# Patient Record
Sex: Female | Born: 1970 | Race: White | Hispanic: No | Marital: Married | Smoking: Never smoker
Health system: Southern US, Community
[De-identification: ages and names within clinical notes are randomized; demographics above are authoritative.]

## PROBLEM LIST (undated history)

## (undated) DIAGNOSIS — N920 Excessive and frequent menstruation with regular cycle: Secondary | ICD-10-CM

## (undated) DIAGNOSIS — D251 Intramural leiomyoma of uterus: Secondary | ICD-10-CM

## (undated) DIAGNOSIS — D649 Anemia, unspecified: Secondary | ICD-10-CM

## (undated) DIAGNOSIS — I10 Essential (primary) hypertension: Secondary | ICD-10-CM

## (undated) DIAGNOSIS — R062 Wheezing: Secondary | ICD-10-CM

## (undated) DIAGNOSIS — K649 Unspecified hemorrhoids: Secondary | ICD-10-CM

## (undated) DIAGNOSIS — E559 Vitamin D deficiency, unspecified: Secondary | ICD-10-CM

## (undated) DIAGNOSIS — J45909 Unspecified asthma, uncomplicated: Secondary | ICD-10-CM

## (undated) HISTORY — DX: Intramural leiomyoma of uterus: D25.1

## (undated) HISTORY — DX: Unspecified asthma, uncomplicated: J45.909

## (undated) HISTORY — DX: Unspecified hemorrhoids: K64.9

## (undated) HISTORY — DX: Wheezing: R06.2

## (undated) HISTORY — PX: NO PAST SURGERIES: SHX2092

## (undated) HISTORY — DX: Anemia, unspecified: D64.9

## (undated) HISTORY — DX: Essential (primary) hypertension: I10

## (undated) HISTORY — PX: OTHER SURGICAL HISTORY: SHX169

## (undated) HISTORY — DX: Excessive and frequent menstruation with regular cycle: N92.0

## (undated) HISTORY — DX: Vitamin D deficiency, unspecified: E55.9

---

## 2000-12-10 ENCOUNTER — Other Ambulatory Visit: Admission: RE | Admit: 2000-12-10 | Discharge: 2000-12-10 | Payer: Self-pay | Admitting: Obstetrics and Gynecology

## 2008-11-07 ENCOUNTER — Other Ambulatory Visit: Admission: RE | Admit: 2008-11-07 | Discharge: 2008-11-07 | Payer: Self-pay | Admitting: Obstetrics and Gynecology

## 2011-06-24 ENCOUNTER — Other Ambulatory Visit (HOSPITAL_COMMUNITY)
Admission: RE | Admit: 2011-06-24 | Discharge: 2011-06-24 | Disposition: A | Discharge status: Home / Self Care | Payer: PRIVATE HEALTH INSURANCE | Source: Ambulatory Visit | Attending: Obstetrics and Gynecology | Admitting: Obstetrics and Gynecology

## 2011-06-24 DIAGNOSIS — Z01419 Encounter for gynecological examination (general) (routine) without abnormal findings: Secondary | ICD-10-CM | POA: Insufficient documentation

## 2012-08-17 ENCOUNTER — Encounter: Payer: Self-pay | Admitting: *Deleted

## 2012-08-18 ENCOUNTER — Encounter: Payer: Self-pay | Admitting: Adult Health

## 2012-08-18 ENCOUNTER — Other Ambulatory Visit (HOSPITAL_COMMUNITY)
Admission: RE | Admit: 2012-08-18 | Discharge: 2012-08-18 | Disposition: A | Discharge status: Home / Self Care | Payer: PRIVATE HEALTH INSURANCE | Source: Ambulatory Visit | Attending: Adult Health | Admitting: Adult Health

## 2012-08-18 ENCOUNTER — Ambulatory Visit (INDEPENDENT_AMBULATORY_CARE_PROVIDER_SITE_OTHER): Payer: PRIVATE HEALTH INSURANCE | Admitting: Adult Health

## 2012-08-18 VITALS — BP 140/100 | HR 78 | Ht 70.0 in | Wt 192.0 lb

## 2012-08-18 DIAGNOSIS — I10 Essential (primary) hypertension: Secondary | ICD-10-CM | POA: Insufficient documentation

## 2012-08-18 DIAGNOSIS — Z01419 Encounter for gynecological examination (general) (routine) without abnormal findings: Secondary | ICD-10-CM

## 2012-08-18 DIAGNOSIS — Z1151 Encounter for screening for human papillomavirus (HPV): Secondary | ICD-10-CM | POA: Insufficient documentation

## 2012-08-18 DIAGNOSIS — Z1212 Encounter for screening for malignant neoplasm of rectum: Secondary | ICD-10-CM

## 2012-08-18 HISTORY — DX: Essential (primary) hypertension: I10

## 2012-08-18 LAB — CBC
HCT: 32.7 % — ABNORMAL LOW (ref 36.0–46.0)
MCHC: 33 g/dL (ref 30.0–36.0)
Platelets: 348 10*3/uL (ref 150–400)
RDW: 15.3 % (ref 11.5–15.5)

## 2012-08-18 LAB — COMPREHENSIVE METABOLIC PANEL
ALT: 13 U/L (ref 0–35)
AST: 10 U/L (ref 0–37)
Albumin: 3.9 g/dL (ref 3.5–5.2)
Alkaline Phosphatase: 46 U/L (ref 39–117)
BUN: 10 mg/dL (ref 6–23)
Potassium: 4.2 mEq/L (ref 3.5–5.3)
Sodium: 136 mEq/L (ref 135–145)

## 2012-08-18 LAB — HEMOCCULT GUIAC POC 1CARD (OFFICE)

## 2012-08-18 LAB — LIPID PANEL
LDL Cholesterol: 78 mg/dL (ref 0–99)
Triglycerides: 50 mg/dL (ref <150)
VLDL: 10 mg/dL (ref 0–40)

## 2012-08-18 MED ORDER — HYDROCHLOROTHIAZIDE 12.5 MG PO CAPS: 12.5000 mg | ORAL_CAPSULE | Freq: Every day | ORAL | Status: DC

## 2012-08-18 NOTE — Patient Instructions (Addendum)
Start HCTZ and decrease salt Increase activity Mammogram now Return 4 weeks check BP, and check at home and record Sign up for my chart

## 2012-08-18 NOTE — Progress Notes (Signed)
Patient ID: Christie Griffin, female   DOB: [birth year], 42 y.o.   MRN: [id number] History of Present Illness: Christie Griffin is a 42 year old white female in for pap and physical.   Current Medications, Allergies, Past Medical History, Past Surgical History, Family History and Social History were reviewed in [employer] medical record.     Review of Systems: Patient denies any headaches, blurred vision, shortness of breath, chest pain, abdominal pain, problems with bowel movements, urination, or intercourse. No joint swelling or mood changes. She is working 12 hour shifts at [employer] in [place]. She uses condoms.    Physical Exam:Blood pressure 140/100, pulse 78, height 5\' 10"  (1.778 m), weight 192 lb (87.091 kg), last menstrual period 07/21/2012.BP arrival 142/98 General:  Well developed, well nourished, no acute distress Skin:  Warm and dry Neck:  Midline trachea, normal thyroid Lungs; Clear to auscultation bilaterally Breast:  No dominant palpable mass, retraction, or nipple discharge Cardiovascular: Regular rate and rhythm Abdomen:  Soft, non tender, no hepatosplenomegaly Pelvic:  External genitalia is normal in appearance.  The vagina is normal in appearance. The cervix is bulbous. Pap performed with HPV.  Uterus is felt to be normal size, shape, and contour.  No adnexal masses or tenderness noted. Rectal: Good sphincter tone, no polyps, or hemorrhoids felt.  Hemoccult negative.she does have a hemorrhoid tag. Extremities:  No swelling or varicosities noted Psych:  Alert and cooperative, seems happy but a little stressed, her daughter graduates this year and one of her friends died suddenly yesterday.  Impression: Yearly exam Hypertension  Plan: Check CBC,CMP,TSH,Lipids Get mammogram now  Start HCTZ 12.5 mg 1 daily, decrease salt, increase activity and check BP at home and record Return in 4 weeks to check BP and 1 year for physical

## 2012-08-22 ENCOUNTER — Telehealth: Payer: Self-pay | Admitting: Adult Health

## 2012-08-22 NOTE — Telephone Encounter (Signed)
Pt aware of labs, take MV with Fe, she has checked her BP and ranges138-142/92-94, keep checking it and follow as scheduled.

## 2012-09-13 ENCOUNTER — Encounter: Payer: Self-pay | Admitting: Adult Health

## 2012-09-13 ENCOUNTER — Ambulatory Visit (INDEPENDENT_AMBULATORY_CARE_PROVIDER_SITE_OTHER): Payer: PRIVATE HEALTH INSURANCE | Admitting: Adult Health

## 2012-09-13 VITALS — BP 122/84 | Ht 70.0 in | Wt 183.0 lb

## 2012-09-13 DIAGNOSIS — I10 Essential (primary) hypertension: Secondary | ICD-10-CM

## 2012-09-13 NOTE — Progress Notes (Signed)
Subjective:     Patient ID: Christie Griffin, female   DOB: 09/01/1970, 42 y.o.   MRN: [id number]  HPI Christie Griffin is back for BP check. She is taking the microzide and decreasing her caffeine and increasing her water.  Review of Systems No complaints Reviewed past medical,surgical, social and family history. Reviewed medications and allergies.     Objective:   Physical Exam Blood pressure 122/84, height 5\' 10"  (1.778 m), weight 183 lb (83.008 kg), last menstrual period 07/21/2012. Skin warm and dry. Neck: mid line trachea, normal thyroid. Lungs: clear to ausculation bilaterally. Cardiovascular: regular rate and rhythm.   She has lost 9 pounds and is feeling good. Assessment:    Hypertension    Plan:    Continue current plan  Follow up in 3 months

## 2012-09-13 NOTE — Patient Instructions (Addendum)
Continue with plan Follow up in 3 months

## 2012-12-28 ENCOUNTER — Telehealth: Payer: Self-pay | Admitting: Adult Health

## 2012-12-28 NOTE — Telephone Encounter (Signed)
Ordered support  Knee his and orthotic inserts for her due to pain

## 2012-12-28 NOTE — Telephone Encounter (Signed)
Spoke with pt. She works at [employer]. Was measured for orthotics. She needs a paper filled out but hasn't seen Dr. Sherril Croon in a long time because she usually sees employee health for sickness and hasn't had to see him. Are you willing to look at this paper and see if you can fill it out? Pt left numbers to the [facility] if you need them, [phone] press 4 (office), [phone] (cell) [facility].

## 2013-01-16 ENCOUNTER — Ambulatory Visit: Payer: PRIVATE HEALTH INSURANCE | Admitting: Adult Health

## 2013-04-20 DIAGNOSIS — D649 Anemia, unspecified: Secondary | ICD-10-CM

## 2013-04-20 HISTORY — DX: Anemia, unspecified: D64.9

## 2013-09-20 ENCOUNTER — Ambulatory Visit (INDEPENDENT_AMBULATORY_CARE_PROVIDER_SITE_OTHER): Payer: PRIVATE HEALTH INSURANCE | Admitting: Adult Health

## 2013-09-20 ENCOUNTER — Ambulatory Visit (HOSPITAL_COMMUNITY)
Admission: RE | Admit: 2013-09-20 | Discharge: 2013-09-20 | Disposition: A | Discharge status: Home / Self Care | Payer: PRIVATE HEALTH INSURANCE | Source: Ambulatory Visit | Attending: Adult Health | Admitting: Adult Health

## 2013-09-20 ENCOUNTER — Encounter: Payer: Self-pay | Admitting: Adult Health

## 2013-09-20 ENCOUNTER — Telehealth: Payer: Self-pay | Admitting: Adult Health

## 2013-09-20 ENCOUNTER — Other Ambulatory Visit: Payer: Self-pay | Admitting: Adult Health

## 2013-09-20 VITALS — BP 144/96 | HR 76 | Ht 67.0 in | Wt 195.0 lb

## 2013-09-20 DIAGNOSIS — K649 Unspecified hemorrhoids: Secondary | ICD-10-CM | POA: Insufficient documentation

## 2013-09-20 DIAGNOSIS — N920 Excessive and frequent menstruation with regular cycle: Secondary | ICD-10-CM | POA: Insufficient documentation

## 2013-09-20 DIAGNOSIS — D649 Anemia, unspecified: Secondary | ICD-10-CM

## 2013-09-20 DIAGNOSIS — R062 Wheezing: Secondary | ICD-10-CM

## 2013-09-20 DIAGNOSIS — I10 Essential (primary) hypertension: Secondary | ICD-10-CM

## 2013-09-20 DIAGNOSIS — Z1231 Encounter for screening mammogram for malignant neoplasm of breast: Secondary | ICD-10-CM

## 2013-09-20 DIAGNOSIS — Z139 Encounter for screening, unspecified: Secondary | ICD-10-CM

## 2013-09-20 DIAGNOSIS — Z01419 Encounter for gynecological examination (general) (routine) without abnormal findings: Secondary | ICD-10-CM

## 2013-09-20 DIAGNOSIS — Z1212 Encounter for screening for malignant neoplasm of rectum: Secondary | ICD-10-CM

## 2013-09-20 HISTORY — DX: Wheezing: R06.2

## 2013-09-20 HISTORY — DX: Excessive and frequent menstruation with regular cycle: N92.0

## 2013-09-20 HISTORY — DX: Unspecified hemorrhoids: K64.9

## 2013-09-20 LAB — HEMOCCULT GUIAC POC 1CARD (OFFICE): FECAL OCCULT BLD: NEGATIVE

## 2013-09-20 LAB — CBC
HCT: 39.1 % (ref 36.0–46.0)
HEMOGLOBIN: 13.1 g/dL (ref 12.0–15.0)
MCH: 28.4 pg (ref 26.0–34.0)
MCHC: 33.5 g/dL (ref 30.0–36.0)
MCV: 84.8 fL (ref 78.0–100.0)
PLATELETS: 321 10*3/uL (ref 150–400)
RBC: 4.61 MIL/uL (ref 3.87–5.11)
RDW: 14.1 % (ref 11.5–15.5)
WBC: 5.9 10*3/uL (ref 4.0–10.5)

## 2013-09-20 NOTE — Progress Notes (Signed)
Patient ID: Christie Griffin, female   DOB: [birth year], 43 y.o.   MRN: [id number] History of Present Illness: Christie Griffin is a 43 year old white female,married in for a physical.She had a normal pap with negative HPV 08/18/12.She is complaining of heavy menses but they are short and has clots,no pain and has had allergies and has had some wheezing.Her Mom past away 07/02/13.   Current Medications, Allergies, Past Medical History, Past Surgical History, Family History and Social History were reviewed in [employer] record.     Review of Systems: Patient denies any headaches, blurred vision, shortness of breath, chest pain, abdominal pain, problems with bowel movements, urination, or intercourse. No joint pain or mood swings,see HPI for positives.She says BP fine at home and work usually about 128/86.    Physical Exam:BP 144/96  Pulse 76  Ht 5\' 7"  (1.702 m)  Wt 195 lb (88.451 kg)  BMI 30.53 kg/m2  LMP 09/01/2013 General:  Well developed, well nourished, no acute distress Skin:  Warm and dry Neck:  Midline trachea, normal thyroid Lungs: there is wheezing on left, right sounds clear Breast:  No dominant palpable mass, retraction, or nipple discharge Cardiovascular: Regular rate and rhythm Abdomen:  Soft, non tender, no hepatosplenomegaly Pelvic:  External genitalia is normal in appearance.  The vagina is normal in appearance. The cervix is bulbous.  Uterus is felt to be normal size, shape, and contour.  No   adnexal masses or tenderness noted. Rectal: Good sphincter tone, no polyps,internal hemorrhoids felt at 5 0'clock  Hemoccult negative. Extremities:  No swelling or varicosities noted Psych:  No mood changes, alert and cooperative,seems happy Discussed ablation for heavy periods, if thinks she may want will get [place] first.  Impression: Yearly gyn exam no pap Hemorrhoids Hypertension Heavy menses History of anemia Wheezing     Plan: Physical in 1  year Continue BP meds and check BP at home if looks like rising take 2 tabs and recheck for several days and let me know Check CBC Chest xray today Take Iron OTC and try zyrtec Review handout on ablation and hemorrhoids and hemorrhoid banding Will talk when CBC and xray back

## 2013-09-20 NOTE — Telephone Encounter (Signed)
Left message that chest xray normal.

## 2013-09-20 NOTE — Patient Instructions (Signed)
Hemorrhoids Hemorrhoids are swollen veins around the rectum or anus. There are two types of hemorrhoids:   Internal hemorrhoids. These occur in the veins just inside the rectum. They may poke through to the outside and become irritated and painful.  External hemorrhoids. These occur in the veins outside the anus and can be felt as a painful swelling or hard lump near the anus. CAUSES  Pregnancy.   Obesity.   Constipation or diarrhea.   Straining to have a bowel movement.   Sitting for long periods on the toilet.  Heavy lifting or other activity that caused you to strain.  Anal intercourse. SYMPTOMS   Pain.   Anal itching or irritation.   Rectal bleeding.   Fecal leakage.   Anal swelling.   One or more lumps around the anus.  DIAGNOSIS  Your caregiver may be able to diagnose hemorrhoids by visual examination. Other examinations or tests that may be performed include:   Examination of the rectal area with a gloved hand (digital rectal exam).   Examination of anal canal using a small tube (scope).   A blood test if you have lost a significant amount of blood.  A test to look inside the colon (sigmoidoscopy or colonoscopy). TREATMENT Most hemorrhoids can be treated at home. However, if symptoms do not seem to be getting better or if you have a lot of rectal bleeding, your caregiver may perform a procedure to help make the hemorrhoids get smaller or remove them completely. Possible treatments include:   Placing a rubber band at the base of the hemorrhoid to cut off the circulation (rubber band ligation).   Injecting a chemical to shrink the hemorrhoid (sclerotherapy).   Using a tool to burn the hemorrhoid (infrared light therapy).   Surgically removing the hemorrhoid (hemorrhoidectomy).   Stapling the hemorrhoid to block blood flow to the tissue (hemorrhoid stapling).  HOME CARE INSTRUCTIONS   Eat foods with fiber, such as whole grains, beans,  nuts, fruits, and vegetables. Ask your doctor about taking products with added fiber in them (fibersupplements).  Increase fluid intake. Drink enough water and fluids to keep your urine clear or pale yellow.   Exercise regularly.   Go to the bathroom when you have the urge to have a bowel movement. Do not wait.   Avoid straining to have bowel movements.   Keep the anal area dry and clean. Use wet toilet paper or moist towelettes after a bowel movement.   Medicated creams and suppositories may be used or applied as directed.   Only take over-the-counter or prescription medicines as directed by your caregiver.   Take warm sitz baths for 15 20 minutes, 3 4 times a day to ease pain and discomfort.   Place ice packs on the hemorrhoids if they are tender and swollen. Using ice packs between sitz baths may be helpful.   Put ice in a plastic bag.   Place a towel between your skin and the bag.   Leave the ice on for 15 20 minutes, 3 4 times a day.   Do not use a donut-shaped pillow or sit on the toilet for long periods. This increases blood pooling and pain.  SEEK MEDICAL CARE IF:  You have increasing pain and swelling that is not controlled by treatment or medicine.  You have uncontrolled bleeding.  You have difficulty or you are unable to have a bowel movement.  You have pain or inflammation outside the area of the hemorrhoids. MAKE SURE YOU:    Understand these instructions.  Will watch your condition.  Will get help right away if you are not doing well or get worse. Document Released: 04/03/2000 Document Revised: 03/23/2012 Document Reviewed: 02/09/2012 [facility] Patient Information 2014 [place]. Physical in 1 year  Mammogram now

## 2013-09-21 ENCOUNTER — Telehealth: Payer: Self-pay | Admitting: Adult Health

## 2013-09-21 NOTE — Telephone Encounter (Signed)
Pt aware CBC normal

## 2013-09-26 ENCOUNTER — Ambulatory Visit (HOSPITAL_COMMUNITY)
Admission: RE | Admit: 2013-09-26 | Discharge: 2013-09-26 | Disposition: A | Discharge status: Home / Self Care | Payer: PRIVATE HEALTH INSURANCE | Source: Ambulatory Visit | Attending: Adult Health | Admitting: Adult Health

## 2013-09-26 DIAGNOSIS — Z1231 Encounter for screening mammogram for malignant neoplasm of breast: Secondary | ICD-10-CM | POA: Insufficient documentation

## 2013-10-16 ENCOUNTER — Telehealth: Payer: Self-pay | Admitting: Adult Health

## 2013-10-16 MED ORDER — FLUCONAZOLE 150 MG PO TABS: ORAL_TABLET | ORAL | Status: DC

## 2013-10-16 NOTE — Telephone Encounter (Signed)
rx diflucan 150 mg # 2 take 1 now and 1 in 3 days if needed with 2 refills

## 2013-10-16 NOTE — Telephone Encounter (Signed)
Spoke with pt. Pt states she started with a yeast infection Friday. She is requesting Diflucan. Pt is at work today. Please advise. Thanks!!! [employer]

## 2013-10-16 NOTE — Telephone Encounter (Signed)
Left message x 1. JSY 

## 2013-10-24 ENCOUNTER — Telehealth: Payer: Self-pay | Admitting: Adult Health

## 2013-10-24 NOTE — Telephone Encounter (Signed)
Still has cough see PCP, had normal chest xray

## 2014-02-19 ENCOUNTER — Encounter: Payer: Self-pay | Admitting: Adult Health

## 2014-05-30 ENCOUNTER — Telehealth: Payer: Self-pay | Admitting: Adult Health

## 2014-05-30 NOTE — Telephone Encounter (Signed)
Wants number to GI for hemorrhoid banding, gave number to [facility]

## 2014-06-28 ENCOUNTER — Encounter: Payer: Self-pay | Admitting: Gastroenterology

## 2014-06-28 ENCOUNTER — Ambulatory Visit (INDEPENDENT_AMBULATORY_CARE_PROVIDER_SITE_OTHER): Payer: PRIVATE HEALTH INSURANCE | Admitting: Gastroenterology

## 2014-06-28 VITALS — BP 148/99 | HR 83 | Temp 98.5°F | Ht 70.0 in | Wt 203.0 lb

## 2014-06-28 DIAGNOSIS — K642 Third degree hemorrhoids: Secondary | ICD-10-CM

## 2014-06-28 NOTE — Progress Notes (Addendum)
   Subjective:    Patient ID: Christie Griffin, female    DOB: 09/30/1970, 44 y.o.   MRN: [id number]  GRIFFIN, JENNIFER, NP-C  HPI  HAS A SKIN TAG ON HER HEMORRHOID AND IT DRIVES HER CRAZY. OFFERED TO CUT IT OFF BUT DECLINED. OVERTIME GOTTEN BIGGER. NO TCS. WENT THROUGH A PHASE HER Hb HAS BEEN LOW. TAKIGN IRON WITH MULTIVIT. MAY HAVE RECTAL ITCHING BUT TAKES STOOL SOFTENER. NO BLOOD IN STOOL.  BMs: 1-2X/DAY. NO WEIGHT LOSS. APPETITE: GOOD. VAGINAL DELIVERY x1(LABOR 15.5 HRS, PUSHED FOR 3 HRS)  PT DENIES FEVER, CHILLS, HEMATOCHEZIA, nausea, vomiting, melena, diarrhea, CHEST PAIN, SHORTNESS OF BREATH, abdominal pain, problems swallowing, OR heartburn or indigestion.   Past Medical History  Diagnosis Date  . Hypertension 08/18/2012  . Hemorrhoid 09/20/2013  . Heavy menses 09/20/2013  . Wheezing 09/20/2013  . Anemia 2015    AUG 2015 Hb 13    Past Surgical History  Procedure Laterality Date  . No past surgeries      No Known Allergies  Current Outpatient Prescriptions  Medication Sig Dispense Refill  . beclomethasone (QVAR) 80 MCG/ACT inhaler Inhale 2 puffs into the lungs 2 (two) times daily.    . cetirizine (ZYRTEC) 10 MG tablet Take 10 mg by mouth daily.    . ferrous sulfate 325 (65 FE) MG tablet Take 325 mg by mouth daily with breakfast.    .      .       Family History  Problem Relation Age of Onset  . Cancer Other     lung,liver,renal,brain,throat,prostate  . Heart disease Mother   . CAD Mother   . Leukemia Mother     CLL  . Diabetes Father   . Cancer Father     kidney to liver lungs  . Cancer Brother 50    brain tumor related to [place]  . Hypertension Brother   . Colon cancer Neg Hx   . Colon polyps Neg Hx    History  Substance Use Topics  . Smoking status: Never Smoker   . Smokeless tobacco: Never Used  . Alcohol Use: No    Review of Systems PER HPI OTHERWISE ALL SYSTEMS ARE NEGATIVE.     Objective:   Physical Exam  Constitutional: She is  oriented to person, place, and time. She appears well-developed and well-nourished. No distress.  HENT:  Head: Normocephalic and atraumatic.  Mouth/Throat: Oropharynx is clear and moist. No oropharyngeal exudate.  Eyes: Pupils are equal, round, and reactive to light. No scleral icterus.  Neck: Normal range of motion. Neck supple.  Cardiovascular: Normal rate, regular rhythm and normal heart sounds.   Pulmonary/Chest: Effort normal and breath sounds normal. No respiratory distress.  Abdominal: Soft. Bowel sounds are normal. She exhibits no distension. There is no tenderness.  Genitourinary: Rectal exam shows internal hemorrhoid. Rectal exam shows no fissure, no mass and anal tone normal.     SKIN TAG  Musculoskeletal: She exhibits no edema.  Lymphadenopathy:    She has no cervical adenopathy.  Neurological: She is alert and oriented to person, place, and time.  NO FOCAL DEFICITS   Psychiatric: She has a normal mood and affect.  Vitals reviewed.   PROCEDURE; ANOSCOPY SCOPE PLACED. REDUNDANT TISSUE IN R ANTERIOR AND POSTERIOR LESS SO IN THE LEFT LATERAL HEMORRHOID BUNDLE. FORMED STOOL IN RECTUM        Assessment & Plan:

## 2014-06-28 NOTE — Patient Instructions (Signed)
DRINK WATER TO KEEP YOUR URINE LIGHT YELLOW.  FOLLOW A HIGH FIBER DIET.  AVOID ITEMS THAT CAUSE BLOATING & GAS.SEE INFO BELOW.  CALL JENNIFER  TO SCHEDULE EXCISION.  SCREENING colonoscopy AT AGE 44.  High-Fiber Diet A high-fiber diet changes your normal diet to include more whole grains, legumes, fruits, and vegetables. Changes in the diet involve replacing refined carbohydrates with unrefined foods. The calorie level of the diet is essentially unchanged. The Dietary Reference Intake (recommended amount) for adult males is 38 grams per day. For adult females, it is 25 grams per day. Pregnant and lactating women should consume 28 grams of fiber per day. Fiber is the intact part of a plant that is not broken down during digestion. Functional fiber is fiber that has been isolated from the plant to provide a beneficial effect in the body. PURPOSE  Increase stool bulk.   Ease and regulate bowel movements.   Lower cholesterol.  INDICATIONS THAT YOU NEED MORE FIBER  Constipation and hemorrhoids.   Uncomplicated diverticulosis (intestine condition) and irritable bowel syndrome.   Weight management.   As a protective measure against hardening of the arteries (atherosclerosis), diabetes, and cancer.   GUIDELINES FOR INCREASING FIBER IN THE DIET  Start adding fiber to the diet slowly. A gradual increase of about 5 more grams (2 slices of whole-wheat bread, 2 servings of most fruits or vegetables, or 1 bowl of high-fiber cereal) per day is best. Too rapid an increase in fiber may result in constipation, flatulence, and bloating.   Drink enough water and fluids to keep your urine clear or pale yellow. Water, juice, or caffeine-free drinks are recommended. Not drinking enough fluid may cause constipation.   Eat a variety of high-fiber foods rather than one type of fiber.   Try to increase your intake of fiber through using high-fiber foods rather than fiber pills or supplements that contain  small amounts of fiber.   The goal is to change the types of food eaten. Do not supplement your present diet with high-fiber foods, but replace foods in your present diet.  INCLUDE A VARIETY OF FIBER SOURCES  Replace refined and processed grains with whole grains, canned fruits with fresh fruits, and incorporate other fiber sources. White rice, white breads, and most bakery goods contain little or no fiber.   Brown whole-grain rice, buckwheat oats, and many fruits and vegetables are all good sources of fiber. These include: broccoli, Brussels sprouts, cabbage, cauliflower, beets, sweet potatoes, white potatoes (skin on), carrots, tomatoes, eggplant, squash, berries, fresh fruits, and dried fruits.   Cereals appear to be the richest source of fiber. Cereal fiber is found in whole grains and bran. Bran is the fiber-rich outer coat of cereal grain, which is largely removed in refining. In whole-grain cereals, the bran remains. In breakfast cereals, the largest amount of fiber is found in those with "bran" in their names. The fiber content is sometimes indicated on the label.   You may need to include additional fruits and vegetables each day.   In baking, for 1 cup white flour, you may use the following substitutions:   1 cup whole-wheat flour minus 2 tablespoons.   1/2 cup white flour plus 1/2 cup whole-wheat flour.

## 2014-06-28 NOTE — Assessment & Plan Note (Signed)
ASSOCIATED WITH LARGE SKIN TAG. MODERATE INTERNAL HEMORRHOIDS BUT RELATIVELY ASYMPTOMATIC  DRINK WATER EAT FIBER DISCUSSED EXCISION WITH JENNIFER GRIFFIN, NP-c AFTER [facility]. PT WILL CONTACT TO SCHEDULE. SCREENING TCS AT AGE 44.

## 2014-09-26 ENCOUNTER — Encounter: Payer: Self-pay | Admitting: Adult Health

## 2014-09-26 ENCOUNTER — Ambulatory Visit (INDEPENDENT_AMBULATORY_CARE_PROVIDER_SITE_OTHER): Payer: PRIVATE HEALTH INSURANCE | Admitting: Adult Health

## 2014-09-26 VITALS — BP 138/88 | HR 84 | Ht 70.0 in | Wt 199.0 lb

## 2014-09-26 DIAGNOSIS — I10 Essential (primary) hypertension: Secondary | ICD-10-CM

## 2014-09-26 DIAGNOSIS — Z1212 Encounter for screening for malignant neoplasm of rectum: Secondary | ICD-10-CM | POA: Diagnosis not present

## 2014-09-26 DIAGNOSIS — K644 Residual hemorrhoidal skin tags: Secondary | ICD-10-CM

## 2014-09-26 DIAGNOSIS — K649 Unspecified hemorrhoids: Secondary | ICD-10-CM

## 2014-09-26 DIAGNOSIS — Z01419 Encounter for gynecological examination (general) (routine) without abnormal findings: Secondary | ICD-10-CM

## 2014-09-26 LAB — HEMOCCULT GUIAC POC 1CARD (OFFICE): Fecal Occult Blood, POC: NEGATIVE

## 2014-09-26 MED ORDER — HYDROCHLOROTHIAZIDE 12.5 MG PO CAPS: 12.5000 mg | ORAL_CAPSULE | Freq: Every day | ORAL | Status: DC

## 2014-09-26 NOTE — Progress Notes (Signed)
Patient ID: Christie Griffin, female   DOB: Oct 06, 1970, 44 y.o.   MRN: [id number] History of Present Illness: Christie Griffin is a 44 year old white female, married in for well woman gyn exam.She had a normal pap with negative HPV 08/18/12.   Current Medications, Allergies, Past Medical History, Past Surgical History, Family History and Social History were reviewed in [employer] record.     Review of Systems: Patient denies any headaches, hearing loss, fatigue, blurred vision, shortness of breath, chest pain, abdominal pain, problems with bowel movements, urination, or intercourse. 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 Saw Dr Oneida Alar about hemorrhoids.   Physical Exam:BP 138/88 mmHg  Pulse 84  Ht 5\' 10"  (1.778 m)  Wt 199 lb (90.266 kg)  BMI 28.55 kg/m2  LMP 09/21/2014 General:  Well developed, well nourished, no acute distress Skin:  Warm and dry Neck:  Midline trachea, normal thyroid, good ROM, no lymphadenopathy Lungs; Clear to auscultation bilaterally Breast:  No dominant palpable mass, retraction, or nipple discharge Cardiovascular: Regular rate and rhythm Abdomen:  Soft, non tender, no hepatosplenomegaly Pelvic:  External genitalia is normal in appearance, no lesions.  The vagina is normal in appearance. Urethra has no lesions or masses. The cervix is bulbous.  Uterus is felt to be normal size, shape, and contour.  No adnexal masses or tenderness noted.Bladder is non tender, no masses felt. Rectal: Good sphincter tone, no polyps, internal hemorrhoids felt.Has skin tag,  Hemoccult negative. Extremities/musculoskeletal:  No swelling or varicosities noted, no clubbing or cyanosis Psych:  No mood changes, alert and cooperative,seems happy   Impression: Well woman gyn exam no pap Hemorrhoids Skin tag Hypertension    Plan: Refilled  microzide 12.5 mg #30 take 1 daily with 11 refills Return in 1 year for pap and physical Mammogram yearly Fasting labs next year

## 2014-09-26 NOTE — Patient Instructions (Signed)
Get mammogram yearly Pap and physical in 1 year

## 2015-05-13 ENCOUNTER — Other Ambulatory Visit: Payer: Self-pay | Admitting: Neurology

## 2015-05-13 MED ORDER — BECLOMETHASONE DIPROPIONATE 80 MCG/ACT IN AERS: 2.0000 | INHALATION_SPRAY | Freq: Two times a day (BID) | RESPIRATORY_TRACT | Status: DC

## 2015-07-18 ENCOUNTER — Telehealth: Payer: Self-pay | Admitting: Adult Health

## 2015-07-18 NOTE — Telephone Encounter (Signed)
Pt called stating that she went to her primary doctor for her sinus and they perscribed her an antibiotic and now she has a yeast infection. Pt would like to know if Christie Griffin could call in some Diflucan for her. Please contact pt

## 2015-07-18 NOTE — Telephone Encounter (Signed)
Spoke with pt. Pt went to Urgent Care and was prescribed an antibiotic. Now she has a yeast infection. Pt is requesting Diflucan. I advised pt that you was out of the office until tomorrow. Pt wants message sent to you to review tomorrow. Thanks!! Clay

## 2015-07-19 MED ORDER — FLUCONAZOLE 150 MG PO TABS: ORAL_TABLET | ORAL | Status: DC

## 2015-07-19 NOTE — Telephone Encounter (Signed)
Will rx diflucan  

## 2015-07-26 ENCOUNTER — Other Ambulatory Visit: Payer: Self-pay | Admitting: Allergy and Immunology

## 2015-07-26 MED ORDER — ALBUTEROL SULFATE HFA 108 (90 BASE) MCG/ACT IN AERS: 2.0000 | INHALATION_SPRAY | RESPIRATORY_TRACT | Status: DC | PRN

## 2015-07-31 ENCOUNTER — Other Ambulatory Visit: Payer: Self-pay | Admitting: Allergy and Immunology

## 2015-07-31 MED ORDER — BECLOMETHASONE DIPROPIONATE 80 MCG/ACT IN AERS: 2.0000 | INHALATION_SPRAY | Freq: Two times a day (BID) | RESPIRATORY_TRACT | Status: DC

## 2015-07-31 MED ORDER — ALBUTEROL SULFATE HFA 108 (90 BASE) MCG/ACT IN AERS: 2.0000 | INHALATION_SPRAY | RESPIRATORY_TRACT | Status: DC | PRN

## 2015-07-31 NOTE — Telephone Encounter (Signed)
Called patient to let her know would send 1 more 30 day supply only to pharmacy. No answer, no machine. Sent scripts.

## 2015-07-31 NOTE — Telephone Encounter (Signed)
Pt called and made appointment for 08/29/2015 at 5.30pm, but needs rx qvar and ventolin.called into [facility].

## 2015-08-01 NOTE — Telephone Encounter (Signed)
Called pt-No answer at home number.  Called cell phone and spoke with pt.   Pt notified of 30 day supply only sent and to keep OV in May.

## 2015-08-29 ENCOUNTER — Encounter: Payer: Self-pay | Admitting: Allergy and Immunology

## 2015-08-29 ENCOUNTER — Ambulatory Visit (INDEPENDENT_AMBULATORY_CARE_PROVIDER_SITE_OTHER): Payer: PRIVATE HEALTH INSURANCE | Admitting: Allergy and Immunology

## 2015-08-29 VITALS — BP 128/86 | HR 76 | Resp 16 | Ht 68.9 in | Wt 197.3 lb

## 2015-08-29 DIAGNOSIS — J309 Allergic rhinitis, unspecified: Secondary | ICD-10-CM

## 2015-08-29 DIAGNOSIS — R05 Cough: Secondary | ICD-10-CM | POA: Diagnosis not present

## 2015-08-29 DIAGNOSIS — H1010 Acute atopic conjunctivitis, unspecified eye: Secondary | ICD-10-CM | POA: Diagnosis not present

## 2015-08-29 DIAGNOSIS — R059 Cough, unspecified: Secondary | ICD-10-CM

## 2015-08-29 DIAGNOSIS — R1110 Vomiting, unspecified: Secondary | ICD-10-CM | POA: Diagnosis not present

## 2015-08-29 MED ORDER — CETIRIZINE HCL 10 MG PO TABS: 10.0000 mg | ORAL_TABLET | Freq: Every day | ORAL | Status: DC

## 2015-08-29 MED ORDER — MOMETASONE FURO-FORMOTEROL FUM 200-5 MCG/ACT IN AERO: 2.0000 | INHALATION_SPRAY | Freq: Two times a day (BID) | RESPIRATORY_TRACT | Status: DC

## 2015-08-29 MED ORDER — ALBUTEROL SULFATE HFA 108 (90 BASE) MCG/ACT IN AERS: 2.0000 | INHALATION_SPRAY | RESPIRATORY_TRACT | Status: DC | PRN

## 2015-08-29 MED ORDER — AZELASTINE-FLUTICASONE 137-50 MCG/ACT NA SUSP: 1.0000 | Freq: Two times a day (BID) | NASAL | Status: DC

## 2015-08-29 NOTE — Progress Notes (Signed)
FOLLOW UP NOTE  RE: Christie Griffin MRN: [id number] DOB: March 02, 1971 [facility] [place] [address] [place] [id number] Date of Office Visit: 08/29/2015  Subjective:  Christie Griffin is a 45 y.o. female who presents today for Follow-up and Medication Refill  Assessment:   1. Cough, probable persistent asthma.   2. Post-tussive emesis.   3. Allergic rhinoconjunctivitis   4.      Previous concern for food allergies--avoidance and emergency action plan in place. Plan:   Meds ordered this encounter  Medications  . mometasone-formoterol (DULERA) 200-5 MCG/ACT AERO    Sig: Inhale 2 puffs into the lungs 2 (two) times daily.    Dispense:  1 Inhaler    Refill:  2  . Azelastine-Fluticasone (DYMISTA) 137-50 MCG/ACT SUSP    Sig: Place 1 spray into the nose 2 (two) times daily.    Dispense:  23 g    Refill:  3  . albuterol (VENTOLIN HFA) 108 (90 Base) MCG/ACT inhaler    Sig: Inhale 2 puffs into the lungs every 4 (four) hours as needed for wheezing or shortness of breath.    Dispense:  1 Inhaler    Refill:  1  . cetirizine (ZYRTEC) 10 MG tablet    Sig: Take 1 tablet (10 mg total) by mouth daily.    Dispense:  30 tablet    Refill:  5   Patient Instructions  1.  Obtain selected labs--specific IgE for foods at [facility]. 2.  Written information on allergy injections as reviewed today and she will communicate with her insurance on coverage/her expense. 3.  Saline nasal wash each evening at shower time. 4.  Dymista one spray twice daily. 5.  Dulera 213mcg 2 puffs twice daily, rinse, gargle spit with water after use. 6.  ProAir HFA 2 puffs every 4 as needed for cough or wheeze. 7.  Continue Zyrtec 10 mg daily. 8.  EpiPen/Benadryl as needed. 9.  Follow-up by phone in the next to 4 weeks and office in 2 months or sooner if needed.  HPI: Christie Griffin returns to the office in follow-up of allergic rhinoconjunctivitis, cough and wheeze, though she  has not been seen since March 2016--requesting medications refills.  She finds she uses albuterol 3 or 4 times a year, likely with seasonal or weather pattern changes.  She has been using Qvar twice daily, but in the summer decrease this to once daily, though still often has intermittent episodes of cough.  Recently she noticed nasal congestion, postnasal drip, fluctuating voice, slight hoarseness though no discolored drainage, difficulty in breathing, headache, sore throat or fever.  In February she received antibiotics from [place] at [facility] for a sinus infection. Denies ED or urgent care visits, prednisone or frequent antibiotic courses. Reports sleep and activity are normal. She minimizes dairy products as this contributed most to her cough, with question of egg, corn, peanut and shellfish.  Christie Griffin has a current medication list which includes the following prescription(s): albuterol, beclomethasone, cetirizine, docusate sodium, ferrous sulfate, fluticasone, hydrochlorothiazide.   Drug Allergies: No Known Allergies  Objective:   Filed Vitals:   08/29/15 1805  BP: 128/86  Pulse: 76  Resp: 16   Physical Exam  Constitutional: She is well-developed, well-nourished, and in no distress.  HENT:  Head: Atraumatic.  Right Ear: Tympanic membrane and ear canal normal.  Left Ear: Tympanic membrane and ear canal normal.  Nose: Mucosal edema present. No rhinorrhea. No epistaxis.  Mouth/Throat: Oropharynx  is clear and moist and mucous membranes are normal. No oropharyngeal exudate, posterior oropharyngeal edema or posterior oropharyngeal erythema.  Neck: Neck supple.  Cardiovascular: Normal rate, S1 normal and S2 normal.   No murmur heard. Pulmonary/Chest: Effort normal. She has no wheezes. She has no rhonchi. She has no rales.  Lymphadenopathy:    She has no cervical adenopathy.   Diagnostics: Spirometry:  FVC 2.63--74%, FEV1 2.08--71%.    Roselyn M. Ishmael Holter, MD  cc:  [facility]

## 2015-08-29 NOTE — Patient Instructions (Signed)
   Selected labs at [facility].  Written information on allergy injections.  Saline nasal wash each evening at shower time.  Dymista one spray twice daily.  Dulera 243mcg 2 puffs twice daily, rinse, gargle spit with water after use.  ProAir HFA 2 puffs every 4 as needed for cough or wheeze.  Continue Zyrtec 10 mg daily.  EpiPen/Benadryl as needed.  Follow-up by phone in the next to 4 weeks and office in 2 months or sooner if needed.

## 2015-09-20 ENCOUNTER — Other Ambulatory Visit: Payer: Self-pay | Admitting: Adult Health

## 2015-09-20 DIAGNOSIS — Z1231 Encounter for screening mammogram for malignant neoplasm of breast: Secondary | ICD-10-CM

## 2015-09-26 ENCOUNTER — Ambulatory Visit (HOSPITAL_COMMUNITY)
Admission: RE | Admit: 2015-09-26 | Discharge: 2015-09-26 | Disposition: A | Discharge status: Home / Self Care | Payer: PRIVATE HEALTH INSURANCE | Source: Ambulatory Visit | Attending: Adult Health | Admitting: Adult Health

## 2015-09-26 DIAGNOSIS — Z1231 Encounter for screening mammogram for malignant neoplasm of breast: Secondary | ICD-10-CM | POA: Diagnosis present

## 2015-09-27 ENCOUNTER — Encounter: Payer: Self-pay | Admitting: Adult Health

## 2015-09-27 ENCOUNTER — Other Ambulatory Visit (HOSPITAL_COMMUNITY)
Admission: RE | Admit: 2015-09-27 | Discharge: 2015-09-27 | Disposition: A | Discharge status: Home / Self Care | Payer: PRIVATE HEALTH INSURANCE | Source: Ambulatory Visit | Attending: Adult Health | Admitting: Adult Health

## 2015-09-27 ENCOUNTER — Other Ambulatory Visit: Payer: Self-pay | Admitting: Allergy and Immunology

## 2015-09-27 ENCOUNTER — Ambulatory Visit (INDEPENDENT_AMBULATORY_CARE_PROVIDER_SITE_OTHER): Payer: PRIVATE HEALTH INSURANCE | Admitting: Adult Health

## 2015-09-27 VITALS — BP 130/82 | HR 86 | Ht 69.5 in | Wt 196.0 lb

## 2015-09-27 DIAGNOSIS — K644 Residual hemorrhoidal skin tags: Secondary | ICD-10-CM | POA: Diagnosis not present

## 2015-09-27 DIAGNOSIS — Z01411 Encounter for gynecological examination (general) (routine) with abnormal findings: Secondary | ICD-10-CM

## 2015-09-27 DIAGNOSIS — Z1151 Encounter for screening for human papillomavirus (HPV): Secondary | ICD-10-CM | POA: Insufficient documentation

## 2015-09-27 DIAGNOSIS — I10 Essential (primary) hypertension: Secondary | ICD-10-CM

## 2015-09-27 DIAGNOSIS — Z01419 Encounter for gynecological examination (general) (routine) without abnormal findings: Secondary | ICD-10-CM

## 2015-09-27 DIAGNOSIS — Z1211 Encounter for screening for malignant neoplasm of colon: Secondary | ICD-10-CM

## 2015-09-27 LAB — CBC
HCT: 36.4 % (ref 35.0–45.0)
HEMOGLOBIN: 11.7 g/dL (ref 11.7–15.5)
MCH: 27.1 pg (ref 27.0–33.0)
MCHC: 32.1 g/dL (ref 32.0–36.0)
MCV: 84.5 fL (ref 80.0–100.0)
MPV: 9.7 fL (ref 7.5–12.5)
Platelets: 362 10*3/uL (ref 140–400)
RBC: 4.31 MIL/uL (ref 3.80–5.10)
RDW: 14.8 % (ref 11.0–15.0)
WBC: 5.6 10*3/uL (ref 3.8–10.8)

## 2015-09-27 LAB — HEMOCCULT GUIAC POC 1CARD (OFFICE): Fecal Occult Blood, POC: NEGATIVE

## 2015-09-27 MED ORDER — HYDROCHLOROTHIAZIDE 12.5 MG PO CAPS: 12.5000 mg | ORAL_CAPSULE | Freq: Every day | ORAL | Status: DC

## 2015-09-27 NOTE — Progress Notes (Signed)
Patient ID: Christie Griffin, female   DOB: 10/29/1970, 45 y.o.   MRN: [id number] History of Present Illness:  Christie Griffin is a 45 year old white female, married, G1P1, in for a well woman gyn exam and pap. She has seen Dr Ishmael Holter recently for her allergies.  Current Medications, Allergies, Past Medical History, Past Surgical History, Family History and Social History were reviewed in [employer] record.     Review of Systems: Patient denies any headaches, hearing loss, fatigue, blurred vision, shortness of breath, chest pain, abdominal pain, problems with bowel movements, urination, or intercourse. No joint pain or mood swings.    Physical Exam:BP 130/82 mmHg  Pulse 86  Ht 5' 9.5" (1.765 m)  Wt 196 lb (88.905 kg)  BMI 28.54 kg/m2  LMP 09/15/2015 General:  Well developed, well nourished, no acute distress Skin:  Warm and dry Neck:  Midline trachea, normal thyroid, good ROM, no lymphadenopathy Lungs; Clear to auscultation bilaterally Breast:  No dominant palpable mass, retraction, or nipple discharge Cardiovascular: Regular rate and rhythm Abdomen:  Soft, non tender, no hepatosplenomegaly Pelvic:  External genitalia is normal in appearance, no lesions.  The vagina is normal in appearance. Urethra has no lesions or masses. The cervix is bulbous.Pap with HPV performed.  Uterus is felt to be normal size, shape, and contour.  No adnexal masses or tenderness noted.Bladder is non tender, no masses felt. Rectal: Good sphincter tone, no polyps, has hemorrhoidal skin tag. Hemoccult negative. Extremities/musculoskeletal:  No swelling or varicosities noted, no clubbing or cyanosis Psych:  No mood changes, alert and cooperative,seems happy   Impression: Well woman gyn exam and pap Hemorrhoidal skin tag Hypertension     Plan: Refilled microzide 12.5 mg #30 take 1 daily with 11 refills Check CBC,CMP,TSH and lipids,A1c and vitamin D Physical in 1 year , pap in 3 if  normal Mammogram yearly, had yesterday

## 2015-09-27 NOTE — Patient Instructions (Signed)
Physical in 1 year pap in 3 if normal Mammogram yearly Will talk when labs back

## 2015-09-28 LAB — COMPREHENSIVE METABOLIC PANEL
ALBUMIN: 3.7 g/dL (ref 3.6–5.1)
ALT: 12 U/L (ref 6–29)
AST: 13 U/L (ref 10–35)
Alkaline Phosphatase: 47 U/L (ref 33–115)
BILIRUBIN TOTAL: 0.3 mg/dL (ref 0.2–1.2)
BUN: 10 mg/dL (ref 7–25)
CHLORIDE: 104 mmol/L (ref 98–110)
CO2: 24 mmol/L (ref 20–31)
CREATININE: 0.94 mg/dL (ref 0.50–1.10)
Calcium: 8.5 mg/dL — ABNORMAL LOW (ref 8.6–10.2)
Glucose, Bld: 86 mg/dL (ref 65–99)
Potassium: 4.3 mmol/L (ref 3.5–5.3)
SODIUM: 138 mmol/L (ref 135–146)
TOTAL PROTEIN: 6.9 g/dL (ref 6.1–8.1)

## 2015-09-28 LAB — LIPID PANEL
Cholesterol: 161 mg/dL (ref 125–200)
HDL: 63 mg/dL (ref >=46)
LDL Cholesterol: 83 mg/dL (ref <130)
TRIGLYCERIDES: 74 mg/dL (ref <150)
Total CHOL/HDL Ratio: 2.6 Ratio (ref <=5.0)
VLDL: 15 mg/dL (ref <30)

## 2015-09-28 LAB — VITAMIN D 25 HYDROXY (VIT D DEFICIENCY, FRACTURES): Vit D, 25-Hydroxy: 18 ng/mL — ABNORMAL LOW (ref 30–100)

## 2015-09-28 LAB — HEMOGLOBIN A1C
Hgb A1c MFr Bld: 5.6 % (ref <5.7)
Mean Plasma Glucose: 114 mg/dL

## 2015-09-28 LAB — TSH: TSH: 2.27 m[IU]/L

## 2015-09-30 ENCOUNTER — Telehealth: Payer: Self-pay | Admitting: Adult Health

## 2015-09-30 ENCOUNTER — Encounter: Payer: Self-pay | Admitting: Adult Health

## 2015-09-30 DIAGNOSIS — E559 Vitamin D deficiency, unspecified: Secondary | ICD-10-CM | POA: Insufficient documentation

## 2015-09-30 HISTORY — DX: Vitamin D deficiency, unspecified: E55.9

## 2015-09-30 LAB — ALLERGY-SHELLFISH PANEL
Crab: <0.1 kU/L
Lobster: <0.1 kU/L
Shrimp IgE: <0.1 kU/L

## 2015-09-30 LAB — ALLERGEN MILK

## 2015-09-30 LAB — MILK COMPONENT PANEL
Allergen, Alpha-lactalb,f76: <0.1 kU/L
Allergen, Beta-lactoglob,f77: <0.1 kU/L

## 2015-09-30 LAB — ALLERGEN, PEANUT COMPONENT PANEL

## 2015-09-30 LAB — EGG COMPONENT PANEL
Allergen, Ovalbumin, f232: <0.1 kU/L
Allergen, Ovomucoid, f233: <0.1 kU/L

## 2015-09-30 LAB — ALLERGEN, PEANUT F13: Peanut IgE: <0.1 kU/L

## 2015-09-30 LAB — ALLERGEN, CORN F8

## 2015-09-30 LAB — ALLERGEN EGG WHITE F1: Egg White IgE: 0.22 kU/L — ABNORMAL HIGH

## 2015-09-30 MED ORDER — CHOLECALCIFEROL 125 MCG (5000 UT) PO CAPS: 5000.0000 [IU] | ORAL_CAPSULE | Freq: Every day | ORAL | Status: DC

## 2015-09-30 NOTE — Telephone Encounter (Signed)
Pt aware of labs and that needs to take vitamin D3 5000 IU every day

## 2015-10-01 LAB — CYTOLOGY - PAP

## 2015-10-23 ENCOUNTER — Telehealth: Payer: Self-pay | Admitting: Allergy and Immunology

## 2015-10-23 NOTE — Telephone Encounter (Signed)
Phone call to patient to review lab results.  Cell phone voicemail is full. And no answer at home #, unable to leave message.

## 2015-11-29 ENCOUNTER — Encounter: Payer: Self-pay | Admitting: Allergy

## 2015-11-29 ENCOUNTER — Ambulatory Visit (INDEPENDENT_AMBULATORY_CARE_PROVIDER_SITE_OTHER): Payer: PRIVATE HEALTH INSURANCE | Admitting: Allergy

## 2015-11-29 VITALS — BP 125/75 | HR 75 | Temp 97.8°F | Resp 16

## 2015-11-29 DIAGNOSIS — J45991 Cough variant asthma: Secondary | ICD-10-CM | POA: Diagnosis not present

## 2015-11-29 DIAGNOSIS — H1010 Acute atopic conjunctivitis, unspecified eye: Secondary | ICD-10-CM

## 2015-11-29 DIAGNOSIS — K9049 Malabsorption due to intolerance, not elsewhere classified: Secondary | ICD-10-CM | POA: Diagnosis not present

## 2015-11-29 DIAGNOSIS — J309 Allergic rhinitis, unspecified: Secondary | ICD-10-CM | POA: Diagnosis not present

## 2015-11-29 MED ORDER — MONTELUKAST SODIUM 10 MG PO TABS: 10.0000 mg | ORAL_TABLET | Freq: Every day | ORAL | 5 refills | Status: DC

## 2015-11-29 NOTE — Patient Instructions (Signed)
1. Cough variant asthma Continue Dulera twice day.  Albuterol as needed Start Singulair 10mg  at bedtime.  Asthma control goals:   Full participation in all desired activities (may need albuterol before activity)  Albuterol use two time or less a week on average (not counting use with activity)  Cough interfering with sleep two time or less a month  Oral steroids no more than once a year  No hospitalizations   2. Allergic rhinoconjunctivitis Continue zyrtec daily and flonase.  Add singulair as above .  3. Possible food allergy:  Foods tested were negative except for low level sensitivity to egg white.  With no previous clinical history of symptoms either immediate or delayed following ingestion, IgE mediated allergy is less likely.  Would proceed cautiously with egg ingestion.

## 2015-11-29 NOTE — Progress Notes (Signed)
Follow-up Note  RE: Christie Griffin MRN: [id number] DOB: [birth year] Date of Office Visit: 11/29/2015   History of present illness: Christie Griffin is a 45 y.o. female presenting today for follow-up of allergic rhinoconjunctivitis, cough and wheeze, previous concern for food allergy.  Last seen by Dr. Ishmael Holter in May 2017.    She states two years ago developed a cough during spring.  That summer was better but in the fall the cough worsened and she thought she had asthma.  She was also having a productive cough of thick foamy, white sputum.  She then had evaluation with Dr. Ishmael Holter.  At her last visit she was changed from Qvar to [facility] which she takes 2puff twice day (some nights will miss evening dose).  Since switching to [facility] she states her albuterol use is less than once a month.  Her cough is improved but may be triggered when she gets "really hot".  However PFTs has not shown signficant improvement since changing to [facility].    For her allergy symptoms she takes Zyrtec and Flonase daily which helps control but not completely.   She had allergy food testing after last visit as she had positive skin testing results and thus labs were drawn.  She denies any immediate or delayed type symptoms following any food ingestion.  She continues to eat the foods she was tested to however states she doesn't consume much shellfish (mostly just in summer) or corn.  She just ate eggs the other day without any symptoms.     She has been remodeling her home and another home for past 1-2 years and reports they have found mold and has constant dust exposure.      Reviewed labs drawn this summer:  Peanut and Peanut component panel, egg component panel, milk and milk component panel, shellfish panel, corn negative.   Egg white 0.22 ku/L   Review of systems: Review of Systems  Constitutional: Negative for chills and fever.  HENT: Negative for sore throat.   Eyes: Negative for redness.    Cardiovascular: Negative for chest pain.  Gastrointestinal: Negative for diarrhea, nausea and vomiting.  Skin: Negative for rash.  Neurological: Negative for headaches.    All other systems negative unless noted above in HPI  Past medical/social/surgical/family history have been reviewed and are unchanged unless specifically indicated below.  No changes  Medication List:   Medication List       Accurate as of 11/29/15  5:26 PM. Always use your most recent med list.          albuterol 108 (90 Base) MCG/ACT inhaler Commonly known as:  VENTOLIN HFA Inhale 2 puffs into the lungs every 4 (four) hours as needed for wheezing or shortness of breath.   cetirizine 10 MG tablet Commonly known as:  ZYRTEC Take 1 tablet (10 mg total) by mouth daily.   Cholecalciferol 5000 units capsule Take 1 capsule (5,000 Units total) by mouth daily.   COLACE PO Take by mouth daily as needed.   fluticasone 50 MCG/ACT nasal spray Commonly known as:  FLONASE Place 1 spray into both nostrils as needed.   hydrochlorothiazide 12.5 MG capsule Commonly known as:  MICROZIDE Take 1 capsule (12.5 mg total) by mouth daily.   mometasone-formoterol 200-5 MCG/ACT Aero Commonly known as:  DULERA Inhale 2 puffs into the lungs 2 (two) times daily.       Known medication allergies: No Known Allergies   Physical examination: Blood pressure 125/75, pulse 75,  temperature 97.8 F (36.6 C), temperature source Oral, resp. rate 16.  General: Alert, interactive, in no acute distress. HEENT: TMs pearly gray, turbinates minimally edematous without discharge, post-pharynx non erythematous. Neck: Supple without lymphadenopathy. Lungs: Clear to auscultation without wheezing, rhonchi or rales. {no increased work of breathing. CV: Normal S1, S2 without murmurs. Abdomen: Nondistended, nontender. Skin: Warm and dry, without lesions or rashes. Extremities:  No clubbing, cyanosis or edema. Neuro:   Grossly  intact.  Diagnositics/Labs: Labs: review above  Spirometry: FEV1: 76%, FVC: 73%, ratio consistent with non-obstructive pattern but c/w restrictive pattern  Assessment and plan:   1. Cough variant asthma Continue Dulera twice day.  Albuterol as needed Start Singulair 10mg  at bedtime.  Asthma control goals:   Full participation in all desired activities (may need albuterol before activity)  Albuterol use two time or less a week on average (not counting use with activity)  Cough interfering with sleep two time or less a month  Oral steroids no more than once a year  No hospitalizations Notify if not meeting goals.   Concern for possible other pulmonary etiology in addition to asthma with mild restrictive pattern as seen on spirometry leading to reduced FVC and FEV1.  Will consider DLCO and full lung volumes for further evaluation if spirometry remains consistent.  2. Allergic rhinoconjunctivitis Continue zyrtec daily and flonase.  Add singulair as above .  3. Possible food allergy/Intolerance:  Foods tested were negative except for low level sensitivity to egg white.  With no previous clinical history of symptoms either immediate or delayed following ingestion, IgE mediated allergy is less likely.   Would proceed cautiously with egg ingestion given sensitivity      I appreciate the opportunity to take part in Pearlina's care. Please do not hesitate to contact me with questions.  Sincerely,   Prudy Feeler, MD Allergy/Immunology [facility] [place] [facility]

## 2015-12-03 ENCOUNTER — Telehealth: Payer: Self-pay | Admitting: *Deleted

## 2015-12-03 NOTE — Telephone Encounter (Signed)
Had short period in July about 4 days, started week early this month with brown discharge.It may be irregular with peri menopause.

## 2016-01-17 ENCOUNTER — Telehealth: Payer: Self-pay | Admitting: Adult Health

## 2016-01-17 NOTE — Telephone Encounter (Signed)
Pt states that she would like a call back from [place], I spoke with patient and let her know that Anderson Malta is not in the office today and she will not receive a phone call until Monday. Pt states that that was okay.

## 2016-01-20 NOTE — Telephone Encounter (Signed)
No voice mail.

## 2016-02-28 ENCOUNTER — Telehealth: Payer: Self-pay | Admitting: Adult Health

## 2016-02-28 MED ORDER — NORETHIN-ETH ESTRAD-FE BIPHAS 1 MG-10 MCG / 10 MCG PO TABS: 1.0000 | ORAL_TABLET | Freq: Every day | ORAL | 11 refills | Status: DC

## 2016-02-28 NOTE — Telephone Encounter (Signed)
Spoke with pt. Pt is having a period every 15-20 days. It lasts 3-7 days. Sometimes heavy. She is working a lot and under some stress. Pt is interested in starting birth control if you think that's ok. Please advise. Thanks!! [place]

## 2016-02-28 NOTE — Telephone Encounter (Signed)
Having shorter cycles,not heavier, some back ache at times, will rx lo loestrin, to try.

## 2016-06-02 ENCOUNTER — Telehealth: Payer: Self-pay | Admitting: Allergy

## 2016-06-02 MED ORDER — CETIRIZINE HCL 10 MG PO TABS: 10.0000 mg | ORAL_TABLET | Freq: Every day | ORAL | 0 refills | Status: DC

## 2016-06-02 NOTE — Telephone Encounter (Signed)
Pt returned cll - advsd will  Electronically send in refill for Zyrtec to [facility]. Pt stated she understood and would be to the March 8th follow up appt.

## 2016-06-02 NOTE — Telephone Encounter (Signed)
Clld pt - Per voicemail recording - unable to leave message due voicemail being full.   If pt calls back please advise a sooner follow up appt will need to be made for medication refills.

## 2016-06-02 NOTE — Telephone Encounter (Signed)
Patient said she needed to make an appt before we would refill prescriptions. She made an appt for 06-25-16 with Dr. Nelva Bush. She would like Dulera, Flonase, Zyrtec and she said either [employer] or Ventolin, she was unsure. Pharmacy is [facility].

## 2016-06-10 ENCOUNTER — Telehealth: Payer: Self-pay | Admitting: Allergy

## 2016-06-10 NOTE — Telephone Encounter (Signed)
Pt was seen by Dr. Nelva Bush and is requesting a return call in regards to a bill she received and feels like the claim has to be resubmitted. Please call her back as soon as possible. Thanks

## 2016-06-10 NOTE — Telephone Encounter (Signed)
Will appeal claim - Dr. Nelva Bush was in network

## 2016-06-25 ENCOUNTER — Encounter: Payer: Self-pay | Admitting: Allergy

## 2016-06-25 ENCOUNTER — Encounter (INDEPENDENT_AMBULATORY_CARE_PROVIDER_SITE_OTHER): Payer: Self-pay

## 2016-06-25 ENCOUNTER — Ambulatory Visit (INDEPENDENT_AMBULATORY_CARE_PROVIDER_SITE_OTHER): Payer: PRIVATE HEALTH INSURANCE | Admitting: Allergy

## 2016-06-25 VITALS — BP 132/82 | HR 92 | Temp 98.2°F | Resp 16 | Ht 67.0 in | Wt 199.4 lb

## 2016-06-25 DIAGNOSIS — J309 Allergic rhinitis, unspecified: Secondary | ICD-10-CM | POA: Diagnosis not present

## 2016-06-25 DIAGNOSIS — K9049 Malabsorption due to intolerance, not elsewhere classified: Secondary | ICD-10-CM

## 2016-06-25 DIAGNOSIS — H1010 Acute atopic conjunctivitis, unspecified eye: Secondary | ICD-10-CM

## 2016-06-25 DIAGNOSIS — J45991 Cough variant asthma: Secondary | ICD-10-CM

## 2016-06-25 MED ORDER — CETIRIZINE HCL 10 MG PO TABS: 10.0000 mg | ORAL_TABLET | Freq: Every day | ORAL | 5 refills | Status: DC

## 2016-06-25 MED ORDER — FLUTICASONE PROPIONATE 50 MCG/ACT NA SUSP: 1.0000 | NASAL | 5 refills | Status: DC | PRN

## 2016-06-25 MED ORDER — MONTELUKAST SODIUM 10 MG PO TABS: 10.0000 mg | ORAL_TABLET | Freq: Every day | ORAL | 5 refills | Status: DC

## 2016-06-25 MED ORDER — ALBUTEROL SULFATE HFA 108 (90 BASE) MCG/ACT IN AERS: 2.0000 | INHALATION_SPRAY | RESPIRATORY_TRACT | 1 refills | Status: DC | PRN

## 2016-06-25 MED ORDER — MOMETASONE FURO-FORMOTEROL FUM 200-5 MCG/ACT IN AERO: 2.0000 | INHALATION_SPRAY | Freq: Two times a day (BID) | RESPIRATORY_TRACT | 5 refills | Status: DC

## 2016-06-25 NOTE — Patient Instructions (Addendum)
Cough variant asthma Continue Dulera 2 puffs daily.  Albuterol as needed Continue Singulair 10mg  (may take in AM for ease of administration -- recommended use at bedtime).  Asthma control goals:   Full participation in all desired activities (may need albuterol before activity)  Albuterol use two time or less a week on average (not counting use with activity)  Cough interfering with sleep two time or less a month  Oral steroids no more than once a year  No hospitalizations   Allergic rhinoconjunctivitis Continue zyrtec and flonase as needed.   Food allergy:    - if interested in introducing stove top egg in diet let us know; we can perform in-office challenge whenever/if you are ready  Follow-up this summer 2018

## 2016-06-25 NOTE — Progress Notes (Signed)
Follow-up Note  RE: Christie Griffin MRN: [id number] DOB: [birth year] Date of Office Visit: 06/25/2016   History of present illness: Christie Griffin is a 46 y.o. female presenting today for follow-up of cough variant asthma, allergic rhinoconjunctivitis and food intolerance.Marland Kitchen She was in the office on 11/29/2015 by myself.  She states she has been doing well since her last visit without any changes in health, surgeries or hospitalizations. 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  She states that she has been taking her Dulera 2 puffs in the morning. She is supposed to take her Ruthe Mannan again in the evening along with Singulair however she reports she works 12 hour shifts and is often very tired when she gets home forgets to take her evening medications.  She denies any nighttime awakenings. She only reports daytime symptoms if she gets overheated or if she has to go on transport.  She has not needed to use her albuterol at all since her last visit nor has she required any ED or urgent care visits or steroids. She does continue Zyrtec and Flonase as needed which helps control her allergy symptoms. She continues to avoid stove top egg. She is able to eat baked egg products. She is very nervous about eating stovetop egg.     Review of systems: Review of Systems  Constitutional: Negative for chills, fever and malaise/fatigue.  HENT: Negative for congestion, ear discharge, ear pain, nosebleeds, sinus pain, sore throat and tinnitus.   Eyes: Negative for discharge and redness.  Respiratory: Negative for cough, shortness of breath and wheezing.   Cardiovascular: Negative for chest pain.  Gastrointestinal: Negative for abdominal pain, nausea and vomiting.  Musculoskeletal: Negative for joint pain and myalgias.  Skin: Negative for itching and rash.  Neurological: Negative for headaches.    All other systems negative unless noted above in HPI  Past  medical/social/surgical/family history have been reviewed and are unchanged unless specifically indicated below.  No changes  Medication List: Allergies as of 06/25/2016   No Known Allergies     Medication List       Accurate as of 06/25/16  3:18 PM. Always use your most recent med list.          albuterol 108 (90 Base) MCG/ACT inhaler Commonly known as:  VENTOLIN HFA Inhale 2 puffs into the lungs every 4 (four) hours as needed for wheezing or shortness of breath.   cetirizine 10 MG tablet Commonly known as:  ZYRTEC Take 1 tablet (10 mg total) by mouth daily.   Cholecalciferol 5000 units capsule Take 1 capsule (5,000 Units total) by mouth daily.   COLACE PO Take by mouth daily as needed.   fluticasone 50 MCG/ACT nasal spray Commonly known as:  FLONASE Place 1 spray into both nostrils as needed.   hydrochlorothiazide 12.5 MG capsule Commonly known as:  MICROZIDE Take 1 capsule (12.5 mg total) by mouth daily.   mometasone-formoterol 200-5 MCG/ACT Aero Commonly known as:  DULERA Inhale 2 puffs into the lungs 2 (two) times daily.   montelukast 10 MG tablet Commonly known as:  SINGULAIR Take 1 tablet (10 mg total) by mouth at bedtime.   Norethindrone-Ethinyl Estradiol-Fe Biphas 1 MG-10 MCG / 10 MCG tablet Commonly known as:  LO LOESTRIN FE Take 1 tablet by mouth daily. Take 1 daily by mouth       Known medication allergies: No Known Allergies   Physical examination: Blood pressure 132/82, pulse 92, temperature 98.2 F (  36.8 C), temperature source Oral, resp. rate 16, height 5\' 7"  (1.702 m), weight 199 lb 6.4 oz (90.4 kg), SpO2 97 %.  General: Alert, interactive, in no acute distress. HEENT: TMs pearly gray, turbinates mildly edematous without discharge, post-pharynx non erythematous. Neck: Supple without lymphadenopathy. Lungs: Clear to auscultation without wheezing, rhonchi or rales. {no increased work of breathing. CV: Normal S1, S2 without  murmurs. Abdomen: Nondistended, nontender. Skin: Warm and dry, without lesions or rashes. Extremities:  No clubbing, cyanosis or edema. Neuro:   Grossly intact.  Diagnositics/Labs:  Spirometry: FEV1: 2.32L  79%, FVC: 2.83L  79%, slightly improved from last study and is near normal.  Assessment and plan:   Cough variant asthma Continue Dulera 215mcg 2 puffs daily.  Albuterol as needed Continue Singulair 10mg  (may take in AM for ease of administration -- recommended use at bedtime).  Asthma control goals:   Full participation in all desired activities (may need albuterol before activity)  Albuterol use two time or less a week on average (not counting use with activity)  Cough interfering with sleep two time or less a month  Oral steroids no more than once a year  No hospitalizations  Allergic rhinoconjunctivitis Continue zyrtec and flonase as needed.   Food intolerance/allergy:    - if interested in introducing stove top egg in diet let us know; we can perform in-office challenge whenever/if you become ready  Follow-up this summer 2018   I appreciate the opportunity to take part in Jakaria's care. Please do not hesitate to contact me with questions.  Sincerely,   Prudy Feeler, MD Allergy/Immunology [facility] [place] [facility] [place]

## 2016-08-21 ENCOUNTER — Other Ambulatory Visit: Payer: Self-pay | Admitting: Adult Health

## 2016-08-21 DIAGNOSIS — Z1231 Encounter for screening mammogram for malignant neoplasm of breast: Secondary | ICD-10-CM

## 2016-10-07 ENCOUNTER — Ambulatory Visit (INDEPENDENT_AMBULATORY_CARE_PROVIDER_SITE_OTHER): Payer: PRIVATE HEALTH INSURANCE | Admitting: Adult Health

## 2016-10-07 ENCOUNTER — Ambulatory Visit (HOSPITAL_COMMUNITY)
Admission: RE | Admit: 2016-10-07 | Discharge: 2016-10-07 | Disposition: A | Discharge status: Home / Self Care | Payer: PRIVATE HEALTH INSURANCE | Source: Ambulatory Visit | Attending: Adult Health | Admitting: Adult Health

## 2016-10-07 ENCOUNTER — Encounter: Payer: Self-pay | Admitting: Adult Health

## 2016-10-07 VITALS — BP 128/86 | HR 99 | Ht 68.5 in | Wt 204.0 lb

## 2016-10-07 DIAGNOSIS — K644 Residual hemorrhoidal skin tags: Secondary | ICD-10-CM

## 2016-10-07 DIAGNOSIS — Z01419 Encounter for gynecological examination (general) (routine) without abnormal findings: Secondary | ICD-10-CM | POA: Diagnosis not present

## 2016-10-07 DIAGNOSIS — I10 Essential (primary) hypertension: Secondary | ICD-10-CM | POA: Diagnosis not present

## 2016-10-07 DIAGNOSIS — Z1212 Encounter for screening for malignant neoplasm of rectum: Secondary | ICD-10-CM

## 2016-10-07 DIAGNOSIS — R195 Other fecal abnormalities: Secondary | ICD-10-CM | POA: Diagnosis not present

## 2016-10-07 DIAGNOSIS — Z1231 Encounter for screening mammogram for malignant neoplasm of breast: Secondary | ICD-10-CM | POA: Diagnosis present

## 2016-10-07 DIAGNOSIS — Z1211 Encounter for screening for malignant neoplasm of colon: Secondary | ICD-10-CM

## 2016-10-07 DIAGNOSIS — Z7689 Persons encountering health services in other specified circumstances: Secondary | ICD-10-CM

## 2016-10-07 LAB — HEMOCCULT GUIAC POC 1CARD (OFFICE): FECAL OCCULT BLD: POSITIVE — AB

## 2016-10-07 MED ORDER — HYDROCHLOROTHIAZIDE 12.5 MG PO CAPS: 12.5000 mg | ORAL_CAPSULE | Freq: Every day | ORAL | 11 refills | Status: DC
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
## 2016-10-29 ENCOUNTER — Ambulatory Visit: Payer: PRIVATE HEALTH INSURANCE | Admitting: Allergy

## 2016-11-02 ENCOUNTER — Ambulatory Visit (INDEPENDENT_AMBULATORY_CARE_PROVIDER_SITE_OTHER): Payer: PRIVATE HEALTH INSURANCE | Admitting: Allergy

## 2016-11-02 ENCOUNTER — Encounter: Payer: Self-pay | Admitting: Allergy

## 2016-11-02 VITALS — BP 118/72 | HR 97 | Temp 98.0°F | Resp 19

## 2016-11-02 DIAGNOSIS — J309 Allergic rhinitis, unspecified: Secondary | ICD-10-CM | POA: Diagnosis not present

## 2016-11-02 DIAGNOSIS — J45991 Cough variant asthma: Secondary | ICD-10-CM | POA: Diagnosis not present

## 2016-11-02 DIAGNOSIS — H1010 Acute atopic conjunctivitis, unspecified eye: Secondary | ICD-10-CM

## 2016-11-02 NOTE — Progress Notes (Signed)
Follow-up Note  RE: Christie Griffin MRN: [id number] DOB: 1970/07/21 Date of Office Visit: 11/02/2016   History of present illness: Christie Griffin is a 46 y.o. female presenting today for follow-up of cough variant asthma, allergic rhinoconjuctivitis, and food allergy. Her last visit was in March 2018. She states she has been doing very well since her last visit. She has had no issues and has not used her Albuterol inhaler since January. She endorses that day symptoms will occur when she has to exert herself at work, such as on a transport or running up stairs. The heat is also a trigger for her day symptoms. When she is triggered, she experiences cough and shortness of breath, but does not have to use her albuterol inhaler. In order to recover, she will sit down and rest, which will will resolve her symptoms. She denies night symptoms. She has not required steroids or ED visits since her last appointment. She endorses that she has improved her compliance with her medications, and is taking her Dulera two puffs twice daily, Singulair, and zyrtec daily. She only uses her flonase on an as needed basis.   On previous allergy testing, she tested positive for egg white. About one month ago she tried egg white, and subsequently ate eggs every morning for one week while on vacation. She had no adverse reactions and tolerated them well.     Review of systems: Review of Systems  Constitutional: Negative.   HENT: Negative.   Eyes: Negative.   Respiratory: Negative.   Cardiovascular: Negative.   Gastrointestinal: Negative.   Genitourinary: Negative.   Musculoskeletal: Negative.   Skin: Negative.   Neurological: Negative.   Endo/Heme/Allergies: Negative.   Psychiatric/Behavioral: Negative.   All other systems reviewed and are negative.   Past medical/social/surgical/family history have been reviewed and are unchanged unless specifically indicated below.  No  changes  Medication List: Allergies as of 11/02/2016   No Known Allergies     Medication List       Accurate as of 11/02/16 11:00 AM. Always use your most recent med list.          albuterol 108 (90 Base) MCG/ACT inhaler Commonly known as:  VENTOLIN HFA Inhale 2 puffs into the lungs every 4 (four) hours as needed for wheezing or shortness of breath.   cetirizine 10 MG tablet Commonly known as:  ZYRTEC Take 1 tablet (10 mg total) by mouth daily.   Cholecalciferol 5000 units capsule Take 1 capsule (5,000 Units total) by mouth daily.   COLACE PO Take by mouth daily as needed.   fluticasone 50 MCG/ACT nasal spray Commonly known as:  FLONASE Place 1 spray into both nostrils as needed.   hydrochlorothiazide 12.5 MG capsule Commonly known as:  MICROZIDE Take 1 capsule (12.5 mg total) by mouth daily.   mometasone-formoterol 200-5 MCG/ACT Aero Commonly known as:  DULERA Inhale 2 puffs into the lungs 2 (two) times daily.   montelukast 10 MG tablet Commonly known as:  SINGULAIR Take 1 tablet (10 mg total) by mouth at bedtime.   Norethindrone-Ethinyl Estradiol-Fe Biphas 1 MG-10 MCG / 10 MCG tablet Commonly known as:  LO LOESTRIN FE Take 1 tablet by mouth daily. Take 1 daily by mouth       Known medication allergies: No Known Allergies   Physical examination: Blood pressure 118/72, pulse 97, temperature 98 F (36.7 C), resp. rate 19, SpO2 97 %.  General: Alert, interactive, in no acute distress. HEENT: TMs pearly  gray, turbinates non-edematous without discharge, post-pharynx unremarkable. Neck: Supple without lymphadenopathy. Lungs: Clear to auscultation without wheezing, rhonchi or rales. {no increased work of breathing. CV: Normal S1, S2 without murmurs. Abdomen: Nondistended, nontender. Skin: Warm and dry, without lesions or rashes. Extremities:  No clubbing, cyanosis or edema. Neuro:   Grossly intact.  Diagnositics/Labs: Labs: None  Spirometry: FEV1:  2.78/78%, FVC: 2.28/78%, ratio consistent with mild restriction this is stable from previous.  Allergy testing: None  Assessment and plan:   1. Cough Variant Asthma -Decrease Dulera 200 mcg to one puff daily. Albuterol as need -Continue Singulair 10mg  (may take in AM for ease of administration -- recommended use at bedtime).  Asthma control goals:   Full participation in all desired activities (may need albuterol before activity)  Albuterol use two time or less a week on average (not counting use with activity)  Cough interfering with sleep two time or less a month  Oral steroids no more than once a year  2. Allergic rhinoconjunctivitis -Continue zyrtec and flonase as needed.    Return in about 6 months (around 05/05/2017).  Rochele Pages, MD Internal Medicine PGY1

## 2016-11-02 NOTE — Patient Instructions (Addendum)
1. Cough Variant Asthma -Decrease Dulera 200 mcg to one puff daily. Albuterol as need -Continue Singulair 10mg  (may take in AM for ease of administration -- recommended use at bedtime).  Asthma control goals:   Full participation in all desired activities (may need albuterol before activity)  Albuterol use two time or less a week on average (not counting use with activity)  Cough interfering with sleep two time or less a month  Oral steroids no more than once a year  2. Allergic rhinoconjunctivitis -Continue zyrtec and flonase as needed.

## 2016-12-14 ENCOUNTER — Telehealth: Payer: Self-pay | Admitting: Adult Health

## 2016-12-14 MED ORDER — NORETHIN-ETH ESTRAD-FE BIPHAS 1 MG-10 MCG / 10 MCG PO TABS: 1.0000 | ORAL_TABLET | Freq: Every day | ORAL | 11 refills | Status: DC

## 2016-12-14 NOTE — Telephone Encounter (Signed)
Patient called stating that she would like a call back from [place], Patient did not state the reason why. Pt states that she will be at her work number [phone] until 5pm and then she could be reached at her cell. Please contact pt

## 2016-12-14 NOTE — Telephone Encounter (Signed)
Will bring hemoccult cards in tomorrow

## 2016-12-15 ENCOUNTER — Other Ambulatory Visit (INDEPENDENT_AMBULATORY_CARE_PROVIDER_SITE_OTHER): Payer: PRIVATE HEALTH INSURANCE

## 2016-12-15 DIAGNOSIS — Z1211 Encounter for screening for malignant neoplasm of colon: Secondary | ICD-10-CM

## 2016-12-15 LAB — HEMOCCULT GUIAC POC 1CARD (OFFICE)
Card #3 Fecal Occult Blood, POC: NEGATIVE
FECAL OCCULT BLD: NEGATIVE
Fecal Occult Blood, POC: NEGATIVE

## 2016-12-30 ENCOUNTER — Telehealth: Payer: Self-pay | Admitting: Adult Health

## 2016-12-30 MED ORDER — FLUCONAZOLE 150 MG PO TABS: ORAL_TABLET | ORAL | 2 refills | Status: DC

## 2016-12-30 NOTE — Telephone Encounter (Signed)
Has yeast, will rx diflucan, aware all 3 hemoccult cards were negative.

## 2017-04-05 ENCOUNTER — Other Ambulatory Visit: Payer: Self-pay | Admitting: Allergy

## 2017-04-05 DIAGNOSIS — J309 Allergic rhinitis, unspecified: Principal | ICD-10-CM

## 2017-04-05 DIAGNOSIS — H1010 Acute atopic conjunctivitis, unspecified eye: Secondary | ICD-10-CM

## 2017-04-05 NOTE — Telephone Encounter (Signed)
Courtesy

## 2017-06-13 ENCOUNTER — Other Ambulatory Visit: Payer: Self-pay | Admitting: Allergy

## 2017-06-13 DIAGNOSIS — J309 Allergic rhinitis, unspecified: Principal | ICD-10-CM

## 2017-06-13 DIAGNOSIS — H1010 Acute atopic conjunctivitis, unspecified eye: Secondary | ICD-10-CM

## 2017-07-01 ENCOUNTER — Other Ambulatory Visit: Payer: Self-pay | Admitting: Allergy

## 2017-07-01 DIAGNOSIS — J309 Allergic rhinitis, unspecified: Secondary | ICD-10-CM

## 2017-07-01 DIAGNOSIS — J45991 Cough variant asthma: Secondary | ICD-10-CM

## 2017-07-01 DIAGNOSIS — H1010 Acute atopic conjunctivitis, unspecified eye: Secondary | ICD-10-CM

## 2017-07-14 ENCOUNTER — Other Ambulatory Visit: Payer: Self-pay | Admitting: Allergy

## 2017-07-14 DIAGNOSIS — J45991 Cough variant asthma: Secondary | ICD-10-CM

## 2017-07-14 DIAGNOSIS — H1010 Acute atopic conjunctivitis, unspecified eye: Secondary | ICD-10-CM

## 2017-07-14 DIAGNOSIS — J309 Allergic rhinitis, unspecified: Secondary | ICD-10-CM

## 2017-07-14 MED ORDER — CETIRIZINE HCL 10 MG PO TABS: 10.0000 mg | ORAL_TABLET | Freq: Every day | ORAL | 3 refills | Status: DC

## 2017-07-14 MED ORDER — MOMETASONE FURO-FORMOTEROL FUM 200-5 MCG/ACT IN AERO: 2.0000 | INHALATION_SPRAY | Freq: Two times a day (BID) | RESPIRATORY_TRACT | 3 refills | Status: DC

## 2017-07-14 MED ORDER — FLUTICASONE PROPIONATE 50 MCG/ACT NA SUSP: 1.0000 | NASAL | 3 refills | Status: DC | PRN

## 2017-07-14 NOTE — Telephone Encounter (Signed)
Patient is requesting Zyrtec,Dulera and Flonase to be sent to [facility]. She said singulair was sent, but not these. She has an appointment on 11-03-17. I asked her if she contacted pharmacy and she said they have requested these.

## 2017-07-14 NOTE — Telephone Encounter (Signed)
Refills have been sent in with 3 additional refills but patient has to be seen in office for further refills.

## 2017-09-06 ENCOUNTER — Other Ambulatory Visit: Payer: Self-pay | Admitting: Adult Health

## 2017-09-06 DIAGNOSIS — Z1231 Encounter for screening mammogram for malignant neoplasm of breast: Secondary | ICD-10-CM

## 2017-10-15 ENCOUNTER — Encounter: Payer: Self-pay | Admitting: Adult Health

## 2017-10-15 ENCOUNTER — Ambulatory Visit (INDEPENDENT_AMBULATORY_CARE_PROVIDER_SITE_OTHER): Payer: PRIVATE HEALTH INSURANCE | Admitting: Adult Health

## 2017-10-15 ENCOUNTER — Ambulatory Visit (HOSPITAL_COMMUNITY)
Admission: RE | Admit: 2017-10-15 | Discharge: 2017-10-15 | Disposition: A | Discharge status: Home / Self Care | Payer: PRIVATE HEALTH INSURANCE | Source: Ambulatory Visit | Attending: Adult Health | Admitting: Adult Health

## 2017-10-15 VITALS — BP 126/84 | HR 95 | Ht 68.0 in | Wt 204.0 lb

## 2017-10-15 DIAGNOSIS — Z1212 Encounter for screening for malignant neoplasm of rectum: Secondary | ICD-10-CM | POA: Diagnosis not present

## 2017-10-15 DIAGNOSIS — Z7689 Persons encountering health services in other specified circumstances: Secondary | ICD-10-CM

## 2017-10-15 DIAGNOSIS — Z1231 Encounter for screening mammogram for malignant neoplasm of breast: Secondary | ICD-10-CM | POA: Diagnosis present

## 2017-10-15 DIAGNOSIS — I10 Essential (primary) hypertension: Secondary | ICD-10-CM | POA: Diagnosis not present

## 2017-10-15 DIAGNOSIS — N926 Irregular menstruation, unspecified: Secondary | ICD-10-CM | POA: Diagnosis not present

## 2017-10-15 DIAGNOSIS — Z1211 Encounter for screening for malignant neoplasm of colon: Secondary | ICD-10-CM | POA: Diagnosis not present

## 2017-10-15 DIAGNOSIS — Z01411 Encounter for gynecological examination (general) (routine) with abnormal findings: Secondary | ICD-10-CM | POA: Diagnosis not present

## 2017-10-15 DIAGNOSIS — Z01419 Encounter for gynecological examination (general) (routine) without abnormal findings: Secondary | ICD-10-CM

## 2017-10-15 LAB — HEMOCCULT GUIAC POC 1CARD (OFFICE): Fecal Occult Blood, POC: NEGATIVE

## 2017-10-15 MED ORDER — NYSTATIN 100000 UNIT/GM EX POWD: Freq: Two times a day (BID) | CUTANEOUS | 1 refills | Status: DC

## 2017-10-15 MED ORDER — FLUCONAZOLE 150 MG PO TABS: ORAL_TABLET | ORAL | 2 refills | Status: DC

## 2017-10-15 MED ORDER — NORETHIN-ETH ESTRAD-FE BIPHAS 1 MG-10 MCG / 10 MCG PO TABS: 1.0000 | ORAL_TABLET | Freq: Every day | ORAL | 11 refills | Status: DC

## 2017-10-15 MED ORDER — HYDROCHLOROTHIAZIDE 12.5 MG PO CAPS: 12.5000 mg | ORAL_CAPSULE | Freq: Every day | ORAL | 11 refills | Status: DC

## 2017-10-15 NOTE — Progress Notes (Signed)
Patient ID: Christie Griffin, female   DOB: 07-Oct-1970, 47 y.o.   MRN: [id number] History of Present Illness: Christie Griffin is a 47 year old white female, married, G1P1, I for well woman gyn exam,she had normal pap with negative HPV,09/27/15. 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    Current Medications, Allergies, Past Medical History, Past Surgical History, Family History and Social History were reviewed in [employer] record.     Review of Systems:  Patient denies any headaches, hearing loss, fatigue, blurred vision, shortness of breath, chest pain, abdominal pain, problems with bowel movements, urination, or intercourse. No joint pain or mood swings. Sweats under breasts, and it gets red and tender at times.    Physical Exam:BP 126/84 (BP Location: Left Arm, Patient Position: Sitting, Cuff Size: Small)   Pulse 95   Ht 5\' 8"  (1.727 m)   Wt 204 lb (92.5 kg)   LMP 10/11/2017   BMI 31.02 kg/m  General:  Well developed, well nourished, no acute distress Skin:  Warm and dry Neck:  Midline trachea, normal thyroid, good ROM, no lymphadenopathy Lungs; Clear to auscultation bilaterally Breast:  No dominant palpable mass, retraction, or nipple discharge Cardiovascular: Regular rate and rhythm Abdomen:  Soft, non tender, no hepatosplenomegaly Pelvic:  External genitalia is normal in appearance, no lesions.  The vagina is normal in appearance. Urethra has no lesions or masses. The cervix is bulbous.  Uterus is felt to be normal size, shape, and contour.  No adnexal masses or tenderness noted.Bladder is non tender, no masses felt. Rectal: Good sphincter tone, no polyps, or hemorrhoids felt.  Hemoccult negative.+skintag Extremities/musculoskeletal:  No swelling or varicosities noted, no clubbing or cyanosis Psych:  No mood changes, alert and cooperative,seems happy PHQ 2 score 0. Will prescribe nystatin powders to see if helps.  Impression: 1. Well woman exam with routine gynecological  exam   2. Screening for colorectal cancer   3. Encounter for menstrual regulation   4. Essential hypertension       Plan: Meds ordered this encounter  Medications  . Norethindrone-Ethinyl Estradiol-Fe Biphas (LO LOESTRIN FE) 1 MG-10 MCG / 10 MCG tablet    Sig: Take 1 tablet by mouth daily. Take 1 daily by mouth    Dispense:  1 Package    Refill:  11    BIN [id number], PCN [id number], GRP [id number]    Order Specific Question:   Supervising Provider    Answer:   Tania Ade H [[id number]]  . fluconazole (DIFLUCAN) 150 MG tablet    Sig: Take 1 now and 1 in 3 days if needed    Dispense:  2 tablet    Refill:  2    Order Specific Question:   Supervising Provider    Answer:   Elonda Husky, LUTHER H [[id number]]  . hydrochlorothiazide (MICROZIDE) 12.5 MG capsule    Sig: Take 1 capsule (12.5 mg total) by mouth daily.    Dispense:  30 capsule    Refill:  11    Order Specific Question:   Supervising Provider    Answer:   Elonda Husky, LUTHER H [[id number]]  . nystatin (MYCOSTATIN/NYSTOP) powder    Sig: Apply topically 2 (two) times daily.    Dispense:  30 g    Refill:  1    Order Specific Question:   Supervising Provider    Answer:   Tania Ade H [[id number]]   Pap and physical in 1 year,with fasting labs then  Had mammogram this am

## 2017-10-28 ENCOUNTER — Telehealth: Payer: Self-pay | Admitting: Obstetrics & Gynecology

## 2017-10-28 DIAGNOSIS — N926 Irregular menstruation, unspecified: Secondary | ICD-10-CM

## 2017-10-28 NOTE — Telephone Encounter (Signed)
Patient states she is still having the "gooey brown discharge" and bled after intercourse last week.  She is wondering if the BCP is enough or if something else needs to be done. Please advise.

## 2017-10-28 NOTE — Telephone Encounter (Signed)
Having irregular spotting, on OCs, will get [place] to assess

## 2017-11-03 ENCOUNTER — Ambulatory Visit: Payer: PRIVATE HEALTH INSURANCE | Admitting: Allergy

## 2017-11-04 ENCOUNTER — Encounter: Payer: Self-pay | Admitting: Allergy

## 2017-11-04 ENCOUNTER — Ambulatory Visit: Payer: PRIVATE HEALTH INSURANCE | Admitting: Allergy

## 2017-11-04 VITALS — BP 136/92 | HR 72 | Resp 16 | Ht 66.5 in | Wt 202.6 lb

## 2017-11-04 DIAGNOSIS — H1010 Acute atopic conjunctivitis, unspecified eye: Secondary | ICD-10-CM | POA: Diagnosis not present

## 2017-11-04 DIAGNOSIS — J309 Allergic rhinitis, unspecified: Secondary | ICD-10-CM | POA: Diagnosis not present

## 2017-11-04 DIAGNOSIS — J45991 Cough variant asthma: Secondary | ICD-10-CM | POA: Diagnosis not present

## 2017-11-04 NOTE — Patient Instructions (Addendum)
1. Cough Variant Asthma -continue Dulera 200 mcg 2 puff daily.  - have access to albuterol inhaler 2 puffs every 4-6 hours as needed for cough/wheeze/shortness of breath/chest tightness.  May use 15-20 minutes prior to activity.   Monitor frequency of use.   -Continue Singulair 10mg  (take at bedtime).  Asthma control goals:   Full participation in all desired activities (may need albuterol before activity)  Albuterol use two time or less a week on average (not counting use with activity)  Cough interfering with sleep two time or less a month  Oral steroids no more than once a year  2. Allergic rhinoconjunctivitis -Continue zyrtec and flonase as needed.    Follow-up 1 year or sooner if needed

## 2017-11-04 NOTE — Addendum Note (Signed)
Addended by: Lucrezia Starch I on: 11/04/2017 05:41 PM   Modules accepted: Orders

## 2017-11-04 NOTE — Progress Notes (Signed)
Follow-up Note  RE: Harleigh Civello MRN: [id number] DOB: 1971/02/13 Date of Office Visit: 11/04/2017   History of present illness: Amra Shukla is a 47 y.o. female presenting today for follow-up of asthma.  She was last seen in the office on 11/05/16 by myself.  Since this visit she denies any major health changes, surgeries or hospitalizations.    She states her asthma has been doing well.  However does report the heat currently is a trigger and thus she tries to stay indoors as much as possible.  She denies though needing to use her albuterol.  Denies any nighttime awakenings.  Has not had any asthma flares in past year requiring ED/UC or oral steroid needs.  She did have a sinus infection in January treated with 1 round of antibiotics and states her breathing was never affected.  She takes zyrtec daily.  She denies any significant nasal symptoms.  She states her eyes on occasion may get itchy and she reports having lower lid edema of right eye several weeks ago that resolved with use of her daily zyrtec.  She states ocular symptoms are not bothersome to warrant an eyedrop.  After last visit she did eat stove-top egg at home and tolerated well.  She states she now eats a boiled egg for breakfast most days of the week.   Review of systems: Review of Systems  Constitutional: Negative for chills, fever and malaise/fatigue.  HENT: Negative for congestion, ear discharge, ear pain, nosebleeds, sinus pain and sore throat.   Eyes: Negative for pain, discharge and redness.  Respiratory: Positive for cough and shortness of breath. Negative for sputum production and wheezing.   Cardiovascular: Negative for chest pain.  Gastrointestinal: Negative for abdominal pain, constipation, diarrhea, heartburn, nausea and vomiting.  Musculoskeletal: Negative for joint pain.  Skin: Negative for itching and rash.  Neurological: Negative for headaches.    All other systems negative unless  noted above in HPI  Past medical/social/surgical/family history have been reviewed and are unchanged unless specifically indicated below.  No changes  Medication List: Allergies as of 11/04/2017   No Known Allergies     Medication List        Accurate as of 11/04/17 10:48 AM. Always use your most recent med list.          albuterol 108 (90 Base) MCG/ACT inhaler Commonly known as:  VENTOLIN HFA Inhale 2 puffs into the lungs every 4 (four) hours as needed for wheezing or shortness of breath.   cetirizine 10 MG tablet Commonly known as:  ZYRTEC Take 1 tablet (10 mg total) by mouth daily.   Cholecalciferol 5000 units capsule Take 1 capsule (5,000 Units total) by mouth daily.   COLACE PO Take by mouth daily as needed.   fluticasone 50 MCG/ACT nasal spray Commonly known as:  FLONASE Place into the nose.   hydrochlorothiazide 12.5 MG capsule Commonly known as:  MICROZIDE Take 1 capsule (12.5 mg total) by mouth daily.   mometasone-formoterol 200-5 MCG/ACT Aero Commonly known as:  DULERA Inhale 2 puffs into the lungs 2 (two) times daily.   montelukast 10 MG tablet Commonly known as:  SINGULAIR Take 1 tablet (10 mg total) by mouth at bedtime.   Norethindrone-Ethinyl Estradiol-Fe Biphas 1 MG-10 MCG / 10 MCG tablet Commonly known as:  LO LOESTRIN FE Take 1 tablet by mouth daily. Take 1 daily by mouth   nystatin powder Commonly known as:  MYCOSTATIN/NYSTOP Apply topically 2 (two) times daily.  Known medication allergies: No Known Allergies   Physical examination: Blood pressure (!) 136/92, pulse 72, resp. rate 16, height 5' 6.5" (1.689 m), weight 202 lb 9.6 oz (91.9 kg), last menstrual period 10/11/2017.  General: Alert, interactive, in no acute distress. HEENT: PERRLA, TMs pearly gray, turbinates minimally edematous without discharge, post-pharynx non erythematous. Neck: Supple without lymphadenopathy. Lungs: Clear to auscultation without wheezing, rhonchi  or rales. {no increased work of breathing. CV: Normal S1, S2 without murmurs. Abdomen: Nondistended, nontender. Skin: Warm and dry, without lesions or rashes. Extremities:  No clubbing, cyanosis or edema. Neuro:   Grossly intact.  Diagnositics/Labs:  Spirometry: FEV1: 2.25L 71%, FVC: 2.61L 66%.  Function is reduced from prior study.  Pt believes it is due to heat exposure this morning  Assessment and plan:   1. Cough Variant Asthma -continue Dulera 200 mcg 2 puff daily.  - have access to albuterol inhaler 2 puffs every 4-6 hours as needed for cough/wheeze/shortness of breath/chest tightness.  May use 15-20 minutes prior to activity.   Monitor frequency of use.   -Continue Singulair 10mg  (take at bedtime).  Asthma control goals:   Full participation in all desired activities (may need albuterol before activity)  Albuterol use two time or less a week on average (not counting use with activity)  Cough interfering with sleep two time or less a month  Oral steroids no more than once a year  2. Allergic rhinoconjunctivitis -Continue zyrtec and flonase as needed.    Follow-up 1 year or sooner if needed  I appreciate the opportunity to take part in Khamiya's care. Please do not hesitate to contact me with questions.  Sincerely,   Prudy Feeler, MD Allergy/Immunology [facility]

## 2017-11-11 ENCOUNTER — Ambulatory Visit (INDEPENDENT_AMBULATORY_CARE_PROVIDER_SITE_OTHER): Payer: PRIVATE HEALTH INSURANCE

## 2017-11-11 ENCOUNTER — Encounter: Payer: Self-pay | Admitting: Adult Health

## 2017-11-11 ENCOUNTER — Telehealth: Payer: Self-pay | Admitting: Adult Health

## 2017-11-11 DIAGNOSIS — N926 Irregular menstruation, unspecified: Secondary | ICD-10-CM | POA: Diagnosis not present

## 2017-11-11 DIAGNOSIS — D251 Intramural leiomyoma of uterus: Secondary | ICD-10-CM

## 2017-11-11 HISTORY — DX: Intramural leiomyoma of uterus: D25.1

## 2017-11-11 NOTE — Progress Notes (Signed)
PELVIC US TA/TV:heterogeneous anteverted uterus w/ mult fibroids,(#1) anterior right intramural  2 x 2.1x 2.9 cm,(#2) LUS subserosal fibroid 1.4 x 1 x 1.4 cm,EEC 5.9 mm,normal ovaries bilat (limited view),no free fluid,no pain during ultrasound

## 2017-11-11 NOTE — Telephone Encounter (Signed)
Pt aware US showed fibroids.continue OCs.

## 2017-11-17 ENCOUNTER — Other Ambulatory Visit: Payer: Self-pay | Admitting: Allergy

## 2017-11-17 DIAGNOSIS — H1010 Acute atopic conjunctivitis, unspecified eye: Secondary | ICD-10-CM

## 2017-11-17 DIAGNOSIS — J309 Allergic rhinitis, unspecified: Principal | ICD-10-CM

## 2017-11-17 DIAGNOSIS — J45991 Cough variant asthma: Secondary | ICD-10-CM

## 2017-11-17 NOTE — Telephone Encounter (Signed)
Patient is calling because none of her meds were at the pharmacy. She is also wondering if we can do a 90 day supply on the zyrtec.   Please Advise.   [place]

## 2017-11-18 MED ORDER — MONTELUKAST SODIUM 10 MG PO TABS: 10.0000 mg | ORAL_TABLET | Freq: Every day | ORAL | 1 refills | Status: DC

## 2017-11-18 MED ORDER — MOMETASONE FURO-FORMOTEROL FUM 200-5 MCG/ACT IN AERO
2.0000 | INHALATION_SPRAY | Freq: Two times a day (BID) | RESPIRATORY_TRACT | 3 refills | Status: DC
Start: 2017-11-18 — End: 2018-11-09

## 2017-11-18 MED ORDER — ALBUTEROL SULFATE HFA 108 (90 BASE) MCG/ACT IN AERS: 2.0000 | INHALATION_SPRAY | RESPIRATORY_TRACT | 1 refills | Status: DC | PRN

## 2017-11-18 MED ORDER — FLUTICASONE PROPIONATE 50 MCG/ACT NA SUSP: 2.0000 | Freq: Every day | NASAL | 1 refills | Status: DC

## 2017-11-18 MED ORDER — CETIRIZINE HCL 10 MG PO TABS: 10.0000 mg | ORAL_TABLET | Freq: Every day | ORAL | 1 refills | Status: DC

## 2017-11-18 NOTE — Telephone Encounter (Signed)
Prescription refills have been sent in.  

## 2017-11-18 NOTE — Addendum Note (Signed)
Addended by: Lucrezia Starch I on: 11/18/2017 02:53 PM   Modules accepted: Orders

## 2018-01-04 ENCOUNTER — Telehealth: Payer: Self-pay

## 2018-01-04 NOTE — Telephone Encounter (Signed)
Okay please had her add in the Mucinex and see if that helps with the cough and if she is not having any improvement please add her in to slot for Thursday or Friday

## 2018-01-04 NOTE — Telephone Encounter (Signed)
When did symptoms start and when was she treated for the URI?   She was doing ok at last visit with me and do not have any documentation of azithromycin or prednisone.  Did she go to her PCP or UC who prescribed the antibiotic and steroids?  Agree with increase in Dulera to 2 puffs twice a day.   Would have her use Mucinex DM to with then mucus and help with cough first before using additional steroid

## 2018-01-04 NOTE — Telephone Encounter (Signed)
Symptoms began last week. Urgent care last Thursday. They prescribed azithromycin and prednisone.

## 2018-01-04 NOTE — Telephone Encounter (Signed)
Patient was seen by Dr. Nelva Bush back in July. She was given azithromycin and 6 day prednisone taper due to a URI with low grade temp(99.8). She has completed all prescribed medications and still has a lingering cough with some white/yellow sputum, She denies fever and body aches. She was out of work for 3 days but now she is back. She got some shortness of breath today and had to use her albuterol inhaler. She also spoke with one of the attending physicians at her place of employment [facility] [employer]). They think that maybe she did not have enough prednisone. She has not used any OTC cough medications. She did increase her Dulera 200 from 2 puffs qd to 2 puffs bid. Please advise on further instructions and thank you.

## 2018-01-05 NOTE — Telephone Encounter (Signed)
Patient states she has been doing OTC cough medicine Coricidin that pharmacy stated was safe for her BP. Patient also tried Mucinex an lots of hydration. Advise patient she will need to be seen on Friday since that is the only day she has off from work.

## 2018-01-07 ENCOUNTER — Ambulatory Visit: Payer: PRIVATE HEALTH INSURANCE | Admitting: Allergy

## 2018-07-04 ENCOUNTER — Ambulatory Visit: Payer: PRIVATE HEALTH INSURANCE | Admitting: Adult Health

## 2018-07-04 ENCOUNTER — Encounter: Payer: Self-pay | Admitting: Adult Health

## 2018-07-04 ENCOUNTER — Other Ambulatory Visit: Payer: Self-pay

## 2018-07-04 VITALS — BP 150/90 | HR 93 | Ht 68.0 in | Wt 206.0 lb

## 2018-07-04 DIAGNOSIS — I10 Essential (primary) hypertension: Secondary | ICD-10-CM | POA: Diagnosis not present

## 2018-07-04 DIAGNOSIS — N926 Irregular menstruation, unspecified: Secondary | ICD-10-CM | POA: Diagnosis not present

## 2018-07-04 MED ORDER — AMLODIPINE BESYLATE 2.5 MG PO TABS: 2.5000 mg | ORAL_TABLET | Freq: Every day | ORAL | 3 refills | Status: DC

## 2018-07-04 MED ORDER — NORETHINDRONE 0.35 MG PO TABS: 1.0000 | ORAL_TABLET | Freq: Every day | ORAL | 11 refills | Status: DC

## 2018-07-04 NOTE — Progress Notes (Signed)
Patient ID: Christie Griffin, female   DOB: Aug 26, 1970, 48 y.o.   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    Current Medications, Allergies, Past Medical History, Past Surgical History, Family History and Social History were reviewed in [employer] record.     Review of Systems: BTB Has gained weight, but going to [employer] now +hot flashes Feels foggy     Physical Exam:BP (!) 150/90 (BP Location: Left Arm, Cuff Size: Normal)   Pulse 93   Ht 5\' 8"  (1.727 m)   Wt 206 lb (93.4 kg)   LMP 06/20/2018   BMI 31.32 kg/m  General:  Well developed, well nourished, no acute distress Skin:  Warm and dry Lungs; Clear to auscultation bilaterally Cardiovascular: Regular rate and rhythm Psych:  No mood changes, alert and cooperative,seems happy Fall risk is low. Continue microzide, will add 2.5 mg norvasc and change OCs to POP. Has lost 6 lbs at [facility].   Impression: 1. Essential hypertension   2. Irregular bleeding       Plan: Finish current pack OCs, then start micronor, and use condoms Meds ordered this encounter  Medications  . norethindrone (MICRONOR,CAMILA,ERRIN) 0.35 MG tablet    Sig: Take 1 tablet (0.35 mg total) by mouth daily.    Dispense:  1 Package    Refill:  11    Order Specific Question:   Supervising Provider    Answer:   Elonda Husky, LUTHER H [[id number]]  . amLODipine (NORVASC) 2.5 MG tablet    Sig: Take 1 tablet (2.5 mg total) by mouth daily.    Dispense:  30 tablet    Refill:  3    Order Specific Question:   Supervising Provider    Answer:   Arletha Grippe  Continue [place] Return 6/29 for pap and physical and will get fasting labs then Review handout on menopause

## 2018-07-04 NOTE — Patient Instructions (Signed)
Menopause  Menopause is the normal time of life when menstrual periods stop completely. It is usually confirmed by 12 months without a menstrual period. The transition to menopause (perimenopause) most often happens between the ages of 45 and 55. During perimenopause, hormone levels change in your body, which can cause symptoms and affect your health. Menopause may increase your risk for:   Loss of bone (osteoporosis), which causes bone breaks (fractures).   Depression.   Hardening and narrowing of the arteries (atherosclerosis), which can cause heart attacks and strokes.  What are the causes?  This condition is usually caused by a natural change in hormone levels that happens as you get older. The condition may also be caused by surgery to remove both ovaries (bilateral oophorectomy).  What increases the risk?  This condition is more likely to start at an earlier age if you have certain medical conditions or treatments, including:   A tumor of the pituitary gland in the brain.   A disease that affects the ovaries and hormone production.   Radiation treatment for cancer.   Certain cancer treatments, such as chemotherapy or hormone (anti-estrogen) therapy.   Heavy smoking and excessive alcohol use.   Family history of early menopause.  This condition is also more likely to develop earlier in women who are very thin.  What are the signs or symptoms?  Symptoms of this condition include:   Hot flashes.   Irregular menstrual periods.   Night sweats.   Changes in feelings about sex. This could be a decrease in sex drive or an increased comfort around your sexuality.   Vaginal dryness and thinning of the vaginal walls. This may cause painful intercourse.   Dryness of the skin and development of wrinkles.   Headaches.   Problems sleeping (insomnia).   Mood swings or irritability.   Memory problems.   Weight gain.   Hair growth on the face and chest.   Bladder infections or problems with urinating.  How  is this diagnosed?  This condition is diagnosed based on your medical history, a physical exam, your age, your menstrual history, and your symptoms. Hormone tests may also be done.  How is this treated?  In some cases, no treatment is needed. You and your health care provider should make a decision together about whether treatment is necessary. Treatment will be based on your individual condition and preferences. Treatment for this condition focuses on managing symptoms. Treatment may include:   Menopausal hormone therapy (MHT).   Medicines to treat specific symptoms or complications.   Acupuncture.   Vitamin or herbal supplements.  Before starting treatment, make sure to let your health care provider know if you have a personal or family history of:   Heart disease.   Breast cancer.   Blood clots.   Diabetes.   Osteoporosis.  Follow these instructions at home:  Lifestyle   Do not use any products that contain nicotine or tobacco, such as cigarettes and e-cigarettes. If you need help quitting, ask your health care provider.   Get at least 30 minutes of physical activity on 5 or more days each week.   Avoid alcoholic and caffeinated beverages, as well as spicy foods. This may help prevent hot flashes.   Get 7-8 hours of sleep each night.   If you have hot flashes, try:  ? Dressing in layers.  ? Avoiding things that may trigger hot flashes, such as spicy food, warm places, or stress.  ? Taking slow, deep   breaths when a hot flash starts.  ? Keeping a fan in your home and office.   Find ways to manage stress, such as deep breathing, meditation, or journaling.   Consider going to group therapy with other women who are having menopause symptoms. Ask your health care provider about recommended group therapy meetings.  Eating and drinking   Eat a healthy, balanced diet that contains whole grains, lean protein, low-fat dairy, and plenty of fruits and vegetables.   Your health care provider may recommend  adding more soy to your diet. Foods that contain soy include tofu, tempeh, and soy milk.   Eat plenty of foods that contain calcium and vitamin D for bone health. Items that are rich in calcium include low-fat milk, yogurt, beans, almonds, sardines, broccoli, and kale.  Medicines   Take over-the-counter and prescription medicines only as told by your health care provider.   Talk with your health care provider before starting any herbal supplements. If prescribed, take vitamins and supplements as told by your health care provider. These may include:  ? Calcium. Women age 51 and older should get 1,200 mg (milligrams) of calcium every day.  ? Vitamin D. Women need 600-800 International Units of vitamin D each day.  ? Vitamins B12 and B6. Aim for 50 micrograms of B12 and 1.5 mg of B6 each day.  General instructions   Keep track of your menstrual periods, including:  ? When they occur.  ? How heavy they are and how long they last.  ? How much time passes between periods.   Keep track of your symptoms, noting when they start, how often you have them, and how long they last.   Use vaginal lubricants or moisturizers to help with vaginal dryness and improve comfort during sex.   Keep all follow-up visits as told by your health care provider. This is important. This includes any group therapy or counseling.  Contact a health care provider if:   You are still having menstrual periods after age 55.   You have pain during sex.   You have not had a period for 12 months and you develop vaginal bleeding.  Get help right away if:   You have:  ? Severe depression.  ? Excessive vaginal bleeding.  ? Pain when you urinate.  ? A fast or irregular heart beat (palpitations).  ? Severe headaches.  ? Abdomen (abdominal) pain or severe indigestion.   You fell and you think you have a broken bone.   You develop leg or chest pain.   You develop vision problems.   You feel a lump in your breast.  Summary   Menopause is the normal  time of life when menstrual periods stop completely. It is usually confirmed by 12 months without a menstrual period.   The transition to menopause (perimenopause) most often happens between the ages of 45 and 55.   Symptoms can be managed through medicines, lifestyle changes, and complementary therapies such as acupuncture.   Eat a balanced diet that is rich in nutrients to promote bone health and heart health and to manage symptoms during menopause.  This information is not intended to replace advice given to you by your health care provider. Make sure you discuss any questions you have with your health care provider.  Document Released: 06/27/2003 Document Revised: 05/09/2016 Document Reviewed: 05/09/2016  Elsevier Interactive Patient Education  2019 [employer]

## 2018-07-05 ENCOUNTER — Telehealth: Payer: Self-pay | Admitting: Allergy

## 2018-07-05 DIAGNOSIS — J45991 Cough variant asthma: Secondary | ICD-10-CM

## 2018-07-05 MED ORDER — ALBUTEROL SULFATE HFA 108 (90 BASE) MCG/ACT IN AERS: 2.0000 | INHALATION_SPRAY | RESPIRATORY_TRACT | 1 refills | Status: DC | PRN

## 2018-07-05 NOTE — Telephone Encounter (Signed)
Patient is requesting a refill on her albuterol inhaler. She made her yearly appointment on July 22.

## 2018-07-05 NOTE — Telephone Encounter (Signed)
rx sent

## 2018-09-14 ENCOUNTER — Other Ambulatory Visit (HOSPITAL_COMMUNITY): Payer: Self-pay | Admitting: Adult Health

## 2018-09-14 DIAGNOSIS — Z1231 Encounter for screening mammogram for malignant neoplasm of breast: Secondary | ICD-10-CM

## 2018-10-17 ENCOUNTER — Other Ambulatory Visit (HOSPITAL_COMMUNITY)
Admission: RE | Admit: 2018-10-17 | Discharge: 2018-10-17 | Disposition: A | Discharge status: Home / Self Care | Payer: PRIVATE HEALTH INSURANCE | Source: Ambulatory Visit | Attending: Adult Health | Admitting: Adult Health

## 2018-10-17 ENCOUNTER — Ambulatory Visit (HOSPITAL_COMMUNITY)
Admission: RE | Admit: 2018-10-17 | Discharge: 2018-10-17 | Disposition: A | Discharge status: Home / Self Care | Payer: PRIVATE HEALTH INSURANCE | Source: Ambulatory Visit | Attending: Adult Health | Admitting: Adult Health

## 2018-10-17 ENCOUNTER — Encounter: Payer: Self-pay | Admitting: Adult Health

## 2018-10-17 ENCOUNTER — Other Ambulatory Visit: Payer: Self-pay

## 2018-10-17 ENCOUNTER — Ambulatory Visit (INDEPENDENT_AMBULATORY_CARE_PROVIDER_SITE_OTHER): Payer: PRIVATE HEALTH INSURANCE | Admitting: Adult Health

## 2018-10-17 VITALS — BP 127/86 | HR 86 | Ht 66.25 in | Wt 188.5 lb

## 2018-10-17 DIAGNOSIS — Z1211 Encounter for screening for malignant neoplasm of colon: Secondary | ICD-10-CM | POA: Diagnosis not present

## 2018-10-17 DIAGNOSIS — Z131 Encounter for screening for diabetes mellitus: Secondary | ICD-10-CM

## 2018-10-17 DIAGNOSIS — D251 Intramural leiomyoma of uterus: Secondary | ICD-10-CM

## 2018-10-17 DIAGNOSIS — Z7689 Persons encountering health services in other specified circumstances: Secondary | ICD-10-CM

## 2018-10-17 DIAGNOSIS — Z01419 Encounter for gynecological examination (general) (routine) without abnormal findings: Secondary | ICD-10-CM | POA: Insufficient documentation

## 2018-10-17 DIAGNOSIS — I10 Essential (primary) hypertension: Secondary | ICD-10-CM

## 2018-10-17 DIAGNOSIS — Z1231 Encounter for screening mammogram for malignant neoplasm of breast: Secondary | ICD-10-CM | POA: Diagnosis present

## 2018-10-17 DIAGNOSIS — Z1212 Encounter for screening for malignant neoplasm of rectum: Secondary | ICD-10-CM

## 2018-10-17 DIAGNOSIS — E559 Vitamin D deficiency, unspecified: Secondary | ICD-10-CM

## 2018-10-17 LAB — HEMOCCULT GUIAC POC 1CARD (OFFICE): Fecal Occult Blood, POC: NEGATIVE

## 2018-10-17 MED ORDER — AMLODIPINE BESYLATE 2.5 MG PO TABS: 2.5000 mg | ORAL_TABLET | Freq: Every day | ORAL | 3 refills | Status: DC

## 2018-10-17 MED ORDER — HYDROCHLOROTHIAZIDE 12.5 MG PO CAPS: 12.5000 mg | ORAL_CAPSULE | Freq: Every day | ORAL | 3 refills | Status: DC

## 2018-10-17 MED ORDER — NORETHINDRONE 0.35 MG PO TABS: 1.0000 | ORAL_TABLET | Freq: Every day | ORAL | 4 refills | Status: DC

## 2018-10-17 NOTE — Progress Notes (Addendum)
Patient ID: Christie Griffin, female   DOB: Jul 29, 1970, 48 y.o.   MRN: [id number] History of Present Illness: Christie Griffin is a 48 year old white female, married, G1 P1, in for a well woman gyn exam and pap. No periods on Micronor and feels better and lost over 16 lbs in last 3 months.  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   Current Medications, Allergies, Past Medical History, Past Surgical History, Family History and Social History were reviewed in [employer] record.     Review of Systems: Patient denies any headaches, hearing loss, fatigue, blurred vision, shortness of breath, chest pain, abdominal pain, problems with bowel movements, urination, or intercourse. No joint pain or mood swings.    Physical Exam:BP 127/86 (BP Location: Left Arm, Patient Position: Sitting, Cuff Size: Normal)   Pulse 86   Ht 5' 6.25" (1.683 m)   Wt 188 lb 8 oz (85.5 kg)   BMI 30.20 kg/m  General:  Well developed, well nourished, no acute distress Skin:  Warm and dry Neck:  Midline trachea, normal thyroid, good ROM, no lymphadenopathy Lungs; Clear to auscultation bilaterally Breast:  No dominant palpable mass, retraction, or nipple discharge Cardiovascular: Regular rate and rhythm Abdomen:  Soft, non tender, no hepatosplenomegaly Pelvic:  External genitalia is normal in appearance, no lesions.  The vagina is normal in appearance. Urethra has no lesions or masses. The cervix is bulbous,pap with HPV performed.Marland Kitchen  Uterus is felt to be normal size, shape, and contour.  No adnexal masses or tenderness noted.Bladder is non tender, no masses felt. Rectal: Good sphincter tone, no polyps, or hemorrhoids felt.  Hemoccult negative. Extremities/musculoskeletal:  No swelling or varicosities noted, no clubbing or cyanosis Psych:  No mood changes, alert and cooperative,seems happy Fall risk is moderate. PHQ 2 score is 0.  Examination chaperoned by Levy Pupa LPN. Praised over weight loss efforts and BP  is better.   Impression:  1. Encounter for gynecological examination with Papanicolaou smear of cervix   2. Screening for colorectal cancer   3. Essential hypertension   4. Vitamin D deficiency   5. Encounter for menstrual regulation   6. Fibroids, intramural   7. Screening for diabetes mellitus      Plan:  Physical in 1 year Pap in 3 if normal Mammogram today Check CBC,CMP,TSH and lipids,A1c and Vitamin D Colonoscopy at 50  Meds ordered this encounter  Medications  . norethindrone (MICRONOR) 0.35 MG tablet    Sig: Take 1 tablet (0.35 mg total) by mouth daily.    Dispense:  3 Package    Refill:  4    Order Specific Question:   Supervising Provider    Answer:   Elonda Husky, LUTHER H [[id number]]  . hydrochlorothiazide (MICROZIDE) 12.5 MG capsule    Sig: Take 1 capsule (12.5 mg total) by mouth daily.    Dispense:  90 capsule    Refill:  3    Order Specific Question:   Supervising Provider    Answer:   Elonda Husky, LUTHER H [[id number]]  . amLODipine (NORVASC) 2.5 MG tablet    Sig: Take 1 tablet (2.5 mg total) by mouth daily.    Dispense:  90 tablet    Refill:  3    Order Specific Question:   Supervising Provider    Answer:   Tania Ade H [[id number]]

## 2018-10-18 LAB — LIPID PANEL
Chol/HDL Ratio: 3 ratio (ref 0.0–4.4)
Cholesterol, Total: 182 mg/dL (ref 100–199)
HDL: 61 mg/dL (ref >39)
LDL Calculated: 112 mg/dL — ABNORMAL HIGH (ref 0–99)
Triglycerides: 44 mg/dL (ref 0–149)
VLDL Cholesterol Cal: 9 mg/dL (ref 5–40)

## 2018-10-18 LAB — COMPREHENSIVE METABOLIC PANEL
ALT: 30 IU/L (ref 0–32)
AST: 28 IU/L (ref 0–40)
Albumin/Globulin Ratio: 2 (ref 1.2–2.2)
Albumin: 4.7 g/dL (ref 3.8–4.8)
Alkaline Phosphatase: 48 IU/L (ref 39–117)
BUN/Creatinine Ratio: 17 (ref 9–23)
BUN: 15 mg/dL (ref 6–24)
Bilirubin Total: 0.7 mg/dL (ref 0.0–1.2)
CO2: 24 mmol/L (ref 20–29)
Calcium: 9.7 mg/dL (ref 8.7–10.2)
Chloride: 101 mmol/L (ref 96–106)
Creatinine, Ser: 0.9 mg/dL (ref 0.57–1.00)
GFR calc Af Amer: 87 mL/min/{1.73_m2} (ref >59)
GFR calc non Af Amer: 76 mL/min/{1.73_m2} (ref >59)
Globulin, Total: 2.4 g/dL (ref 1.5–4.5)
Glucose: 90 mg/dL (ref 65–99)
Potassium: 4.1 mmol/L (ref 3.5–5.2)
Sodium: 140 mmol/L (ref 134–144)
Total Protein: 7.1 g/dL (ref 6.0–8.5)

## 2018-10-18 LAB — CBC
Hematocrit: 36.8 % (ref 34.0–46.6)
Hemoglobin: 12.3 g/dL (ref 11.1–15.9)
MCH: 28.9 pg (ref 26.6–33.0)
MCHC: 33.4 g/dL (ref 31.5–35.7)
MCV: 87 fL (ref 79–97)
Platelets: 347 10*3/uL (ref 150–450)
RBC: 4.25 x10E6/uL (ref 3.77–5.28)
RDW: 14.1 % (ref 11.7–15.4)
WBC: 6.5 10*3/uL (ref 3.4–10.8)

## 2018-10-18 LAB — TSH: TSH: 1.57 u[IU]/mL (ref 0.450–4.500)

## 2018-10-18 LAB — HEMOGLOBIN A1C
Est. average glucose Bld gHb Est-mCnc: 117 mg/dL
Hgb A1c MFr Bld: 5.7 % — ABNORMAL HIGH (ref 4.8–5.6)

## 2018-10-18 LAB — VITAMIN D 25 HYDROXY (VIT D DEFICIENCY, FRACTURES): Vit D, 25-Hydroxy: 33.7 ng/mL (ref 30.0–100.0)

## 2018-10-20 LAB — CYTOLOGY - PAP
Diagnosis: NEGATIVE
HPV: NOT DETECTED

## 2018-10-25 ENCOUNTER — Telehealth: Payer: Self-pay | Admitting: *Deleted

## 2018-10-25 NOTE — Telephone Encounter (Signed)
Patient has a question regarding blood work. Please advise [phone]

## 2018-10-26 NOTE — Telephone Encounter (Signed)
Called patient back and discussed her concerns.

## 2018-11-09 ENCOUNTER — Other Ambulatory Visit: Payer: Self-pay

## 2018-11-09 ENCOUNTER — Ambulatory Visit: Payer: PRIVATE HEALTH INSURANCE | Admitting: Allergy

## 2018-11-09 ENCOUNTER — Encounter: Payer: Self-pay | Admitting: Allergy

## 2018-11-09 VITALS — BP 116/90 | HR 82 | Temp 97.9°F | Resp 18

## 2018-11-09 DIAGNOSIS — J45991 Cough variant asthma: Secondary | ICD-10-CM

## 2018-11-09 DIAGNOSIS — J3089 Other allergic rhinitis: Secondary | ICD-10-CM

## 2018-11-09 DIAGNOSIS — H1013 Acute atopic conjunctivitis, bilateral: Secondary | ICD-10-CM

## 2018-11-09 MED ORDER — MONTELUKAST SODIUM 10 MG PO TABS: 10.0000 mg | ORAL_TABLET | Freq: Every day | ORAL | 1 refills | Status: DC

## 2018-11-09 MED ORDER — CETIRIZINE HCL 10 MG PO TABS: 10.0000 mg | ORAL_TABLET | Freq: Every day | ORAL | 1 refills | Status: DC

## 2018-11-09 MED ORDER — ALBUTEROL SULFATE HFA 108 (90 BASE) MCG/ACT IN AERS: 2.0000 | INHALATION_SPRAY | RESPIRATORY_TRACT | 1 refills | Status: DC | PRN

## 2018-11-09 MED ORDER — DULERA 200-5 MCG/ACT IN AERO: 2.0000 | INHALATION_SPRAY | Freq: Every day | RESPIRATORY_TRACT | 1 refills | Status: DC

## 2018-11-09 MED ORDER — FLUTICASONE PROPIONATE 50 MCG/ACT NA SUSP: 2.0000 | Freq: Every day | NASAL | 1 refills | Status: DC

## 2018-11-09 NOTE — Progress Notes (Signed)
Follow-up Note  RE: Christie Griffin MRN: [id number] DOB: 1971-01-29 Date of Office Visit: 11/09/2018   History of present illness: Christie Griffin is a 48 y.o. female presenting today for follow-up of cough variant asthma and allergic rhinitis with conjunctivitis.  She was last seen in the office on 11/04/17 by myself.    She states back in September that she did have a significant respiratory illness where she was having a lot of coughing and sinus symptoms including congestion, pressure and drainage.  She states she went to an urgent care where she was diagnosed with an asthma exacerbation as well as acute nasopharyngitis and bronchitis.  She was provided with a prescription for a Z-Pak, nasal Atrovent and a 6-day prednisone pack.  She denies having any fevers throughout this.  She states that she did not really feel any better after taking all of these medicines in fact she felt like the nasal Atrovent "dry" her up too much and she developed ear pain as well as continued cough and drainage.  She then went to her regular provider and was diagnosed with an ear infection as well and was given another 10 days of prednisone and she believes Augmentin that she took for 10 days.  Her symptoms did clear after this course.  However during all of this she states that her blood pressure was elevated and she was started on Norvasc which she continues on. She states around February she noticed that she had gained weight and states that she was eating a rather unhealthy diet.  She started on weight watchers in March and has since lost 26 pounds.  She feels like she is able to breathe better with his weight loss.  She also states that her blood pressure has been under control but she has continued on the Norvasc as she has had good blood pressure stabilization. She otherwise denies any other asthma flares since the fall.  She states this was really the only time she has needed to use her albuterol.  She has been very consistent with taking her Singulair as well as her antihistamines and no sprays especially during this COVID time.  She also takes her Dulera 200 mcg 2 puffs once a day.  She states when she did have a flare she did increase it to twice a day dosing.   Review of systems: Review of Systems  Constitutional: Positive for weight loss. Negative for chills, fever and malaise/fatigue.  HENT: Negative for congestion, ear discharge, ear pain, nosebleeds, sinus pain and sore throat.   Eyes: Negative for pain, discharge and redness.  Respiratory: Negative for cough, sputum production, shortness of breath and wheezing.   Cardiovascular: Negative.   Gastrointestinal: Negative for abdominal pain, constipation, diarrhea, heartburn, nausea and vomiting.  Musculoskeletal: Positive for joint pain (Due to ankle sprain).  Skin: Negative for itching and rash.  Neurological: Negative for headaches.    All other systems negative unless noted above in HPI  Past medical/social/surgical/family history have been reviewed and are unchanged unless specifically indicated below.  No changes  Medication List: Allergies as of 11/09/2018   No Known Allergies     Medication List       Accurate as of November 09, 2018 10:55 AM. If you have any questions, ask your nurse or doctor.        albuterol 108 (90 Base) MCG/ACT inhaler Commonly known as: Ventolin HFA Inhale 2 puffs into the lungs every 4 (four) hours as needed for  wheezing or shortness of breath.   amLODipine 2.5 MG tablet Commonly known as: NORVASC Take 1 tablet (2.5 mg total) by mouth daily.   cetirizine 10 MG tablet Commonly known as: ZYRTEC Take 1 tablet (10 mg total) by mouth daily.   Cholecalciferol 125 MCG (5000 UT) capsule Take 1 capsule (5,000 Units total) by mouth daily.   COLACE PO Take by mouth daily as needed.   fluticasone 50 MCG/ACT nasal spray Commonly known as: FLONASE Place 2 sprays into both nostrils daily.  What changed:   when to take this  reasons to take this   hydrochlorothiazide 12.5 MG capsule Commonly known as: MICROZIDE Take 1 capsule (12.5 mg total) by mouth daily.   mometasone-formoterol 200-5 MCG/ACT Aero Commonly known as: Dulera Inhale 2 puffs into the lungs 2 (two) times daily. What changed: when to take this   montelukast 10 MG tablet Commonly known as: SINGULAIR Take 1 tablet (10 mg total) by mouth at bedtime.   norethindrone 0.35 MG tablet Commonly known as: MICRONOR Take 1 tablet (0.35 mg total) by mouth daily.       Known medication allergies: No Known Allergies   Physical examination: Blood pressure 116/90, pulse 82, temperature 97.9 F (36.6 C), temperature source Temporal, resp. rate 18, SpO2 96 %.   General: Alert, interactive, in no acute distress. HEENT: PERRLA, TMs pearly gray, turbinates non-edematous without discharge, post-pharynx non erythematous. Neck: Supple without lymphadenopathy. Lungs: Clear to auscultation without wheezing, rhonchi or rales. {no increased work of breathing. CV: Normal S1, S2 without murmurs. Abdomen: Nondistended, nontender. Skin: Warm and dry, without lesions or rashes. Extremities:  No clubbing, cyanosis or edema. Neuro:   Grossly intact.  Diagnositics/Labs:  Spirometry: FEV1: 2.2L 70%, FVC: 2.76L 70% predicted.  This is a mild improvement since her previous study  Assessment and plan:   1. Cough Variant Asthma - continue Dulera 200 mcg 2 puff daily.  Increase to 2 puffs twice a day during respiratory illnesses or flares - have access to albuterol inhaler 2 puffs every 4-6 hours as needed for cough/wheeze/shortness of breath/chest tightness.  May use 15-20 minutes prior to activity.   Monitor frequency of use.   - Continue Singulair 10mg  (take at bedtime).  Asthma control goals:   Full participation in all desired activities (may need albuterol before activity)  Albuterol use two time or less a week on  average (not counting use with activity)  Cough interfering with sleep two time or less a month  Oral steroids no more than once a year  2. Allergic rhinitis with conjunctivitis -Continue zyrtec and flonase as needed.    Follow-up 6-12 months or sooner if needed  I appreciate the opportunity to take part in Christie Griffin's care. Please do not hesitate to contact me with questions.  Sincerely,   Prudy Feeler, MD Allergy/Immunology [facility] [place] [facility] [place]

## 2018-11-09 NOTE — Patient Instructions (Addendum)
1. Cough Variant Asthma - continue Dulera 200 mcg 2 puff daily.  Increase to 2 puffs twice a day during respiratory illnesses or flares - have access to albuterol inhaler 2 puffs every 4-6 hours as needed for cough/wheeze/shortness of breath/chest tightness.  May use 15-20 minutes prior to activity.   Monitor frequency of use.   - Continue Singulair 10mg  (take at bedtime).  Asthma control goals:   Full participation in all desired activities (may need albuterol before activity)  Albuterol use two time or less a week on average (not counting use with activity)  Cough interfering with sleep two time or less a month  Oral steroids no more than once a year  2. Allergic rhinoconjunctivitis -Continue zyrtec and flonase as needed.    Follow-up 6-12 months or sooner if needed

## 2019-03-08 ENCOUNTER — Telehealth: Payer: Self-pay | Admitting: *Deleted

## 2019-03-08 NOTE — Telephone Encounter (Signed)
In Feb. Or March, her birth control was changed to Micronor. Pt went about 5 months with no period but now is having periods. Pt has had discharge everyday for the last month. Should she stop this pill and see what her body does or should she try something else? She is supposed to start a new pack in 2.5 weeks. 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 Please advise. Thanks!! [place]

## 2019-03-08 NOTE — Telephone Encounter (Signed)
Pt advised of Jenn's recommendations and voiced understanding. [place]

## 2019-03-08 NOTE — Telephone Encounter (Signed)
Pt left message that she needs a nurse to call her back today if possible.

## 2019-03-10 ENCOUNTER — Other Ambulatory Visit: Payer: Self-pay | Admitting: Allergy

## 2019-04-24 ENCOUNTER — Telehealth: Payer: Self-pay | Admitting: Allergy

## 2019-04-24 NOTE — Telephone Encounter (Signed)
Pt called and said that she had no voice and was coughing some with mucus color yellow and green. She said that may have a ear infection. [facility] in [place]. [phone]. She needs to have something call in.

## 2019-04-24 NOTE — Telephone Encounter (Signed)
Patient has lost her voice and states she is not having other symptoms of COVID-19. No fever, she had an ear ache and tried a home remedy which help but the following day she lost her voice. 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 Symptoms happened 9 days ago. Patient believes her symptoms begin in her ear and led to her to conditions as we speak. Patient is concern when wearing her mask it is causing her to cough coughing up sputum green/yellowish color. Patient doesn't complain of and sinus pressure or SOB. Patient did want Dr. Nelva Bush to call in something for her with this condition.

## 2019-04-25 MED ORDER — AMOXICILLIN-POT CLAVULANATE 875-125 MG PO TABS: 1.0000 | ORAL_TABLET | Freq: Two times a day (BID) | ORAL | 0 refills | Status: DC

## 2019-04-25 MED ORDER — PREDNISONE 10 MG PO TABS: ORAL_TABLET | ORAL | 0 refills | Status: DC

## 2019-04-25 MED ORDER — AMOXICILLIN-POT CLAVULANATE 875-125 MG PO TABS: 1.0000 | ORAL_TABLET | Freq: Two times a day (BID) | ORAL | 0 refills | Status: AC

## 2019-04-25 NOTE — Telephone Encounter (Signed)
She had similar symptoms over summer that resolved with Augmentin and prednisone.  Given symptoms ongoing >7 days is more consistent with sinusitis with asthma flare leading to productive cough.  Please send in Augment 875mg  1 tab twice a day x 10 days and prednisone Tuesday pack.

## 2019-04-25 NOTE — Addendum Note (Signed)
Addended by: Valere Dross on: 04/25/2019 10:29 AM   Modules accepted: Orders

## 2019-04-25 NOTE — Telephone Encounter (Signed)
Tuesday pack of prednisone and antibiotics sent to patient preferred pharmacy.

## 2019-05-05 ENCOUNTER — Telehealth: Payer: Self-pay | Admitting: *Deleted

## 2019-05-05 MED ORDER — FLUCONAZOLE 150 MG PO TABS: ORAL_TABLET | ORAL | 1 refills | Status: DC

## 2019-05-05 NOTE — Telephone Encounter (Signed)
Will rx diflucan  

## 2019-05-05 NOTE — Addendum Note (Signed)
Addended by: Derrek Monaco A on: 05/05/2019 01:15 PM   Modules accepted: Orders

## 2019-05-05 NOTE — Telephone Encounter (Signed)
Patient left message on nurse line that she has been on an antibiotic for 2 weeks and now has a yeast infection.  She is requesting Diflucan be sent to [facility] in [place]. She will check back with her pharmacy later today.

## 2019-07-24 ENCOUNTER — Other Ambulatory Visit (HOSPITAL_COMMUNITY): Payer: Self-pay | Admitting: Adult Health

## 2019-07-24 DIAGNOSIS — Z1231 Encounter for screening mammogram for malignant neoplasm of breast: Secondary | ICD-10-CM

## 2019-07-25 ENCOUNTER — Other Ambulatory Visit: Payer: Self-pay | Admitting: Allergy

## 2019-07-25 ENCOUNTER — Other Ambulatory Visit: Payer: Self-pay | Admitting: *Deleted

## 2019-07-25 DIAGNOSIS — J45991 Cough variant asthma: Secondary | ICD-10-CM

## 2019-07-25 DIAGNOSIS — J3089 Other allergic rhinitis: Secondary | ICD-10-CM

## 2019-07-25 MED ORDER — CETIRIZINE HCL 10 MG PO TABS: 10.0000 mg | ORAL_TABLET | Freq: Every day | ORAL | 1 refills | Status: DC

## 2019-07-25 MED ORDER — MONTELUKAST SODIUM 10 MG PO TABS: 10.0000 mg | ORAL_TABLET | Freq: Every day | ORAL | 1 refills | Status: DC

## 2019-07-26 ENCOUNTER — Telehealth: Payer: Self-pay

## 2019-07-26 NOTE — Telephone Encounter (Addendum)
Fax from [facility]:  Christie Griffin not covered Alternatives include:   Advair HFA/Diskus Symbicort Breo Ellipta Anoro Trelegy Flovent  Please select covered alternative  Pt has not tried or failed any of the above medications according to our records.

## 2019-07-27 MED ORDER — BUDESONIDE-FORMOTEROL FUMARATE 160-4.5 MCG/ACT IN AERO: 2.0000 | INHALATION_SPRAY | Freq: Two times a day (BID) | RESPIRATORY_TRACT | 5 refills | Status: DC

## 2019-07-27 NOTE — Telephone Encounter (Signed)
Ok let's do Symbicort 179mcg 2 puffs twice a day

## 2019-07-27 NOTE — Telephone Encounter (Signed)
New prescription has been sent in. Called patient and informed. Patient verbalized understanding.

## 2019-10-25 ENCOUNTER — Encounter: Payer: Self-pay | Admitting: Adult Health

## 2019-10-25 ENCOUNTER — Ambulatory Visit: Payer: PRIVATE HEALTH INSURANCE | Admitting: Allergy

## 2019-10-25 ENCOUNTER — Ambulatory Visit (INDEPENDENT_AMBULATORY_CARE_PROVIDER_SITE_OTHER): Payer: PRIVATE HEALTH INSURANCE | Admitting: Adult Health

## 2019-10-25 ENCOUNTER — Ambulatory Visit (HOSPITAL_COMMUNITY)
Admission: RE | Admit: 2019-10-25 | Discharge: 2019-10-25 | Disposition: A | Discharge status: Home / Self Care | Payer: PRIVATE HEALTH INSURANCE | Source: Ambulatory Visit | Attending: Adult Health | Admitting: Adult Health

## 2019-10-25 ENCOUNTER — Other Ambulatory Visit: Payer: Self-pay

## 2019-10-25 VITALS — BP 121/80 | HR 95 | Ht 67.0 in | Wt 190.0 lb

## 2019-10-25 DIAGNOSIS — I10 Essential (primary) hypertension: Secondary | ICD-10-CM

## 2019-10-25 DIAGNOSIS — Z01419 Encounter for gynecological examination (general) (routine) without abnormal findings: Secondary | ICD-10-CM | POA: Diagnosis not present

## 2019-10-25 DIAGNOSIS — Z1231 Encounter for screening mammogram for malignant neoplasm of breast: Secondary | ICD-10-CM | POA: Diagnosis present

## 2019-10-25 DIAGNOSIS — Z1211 Encounter for screening for malignant neoplasm of colon: Secondary | ICD-10-CM | POA: Diagnosis not present

## 2019-10-25 DIAGNOSIS — N926 Irregular menstruation, unspecified: Secondary | ICD-10-CM

## 2019-10-25 DIAGNOSIS — Z7689 Persons encountering health services in other specified circumstances: Secondary | ICD-10-CM

## 2019-10-25 LAB — HEMOCCULT GUIAC POC 1CARD (OFFICE): Fecal Occult Blood, POC: NEGATIVE

## 2019-10-25 MED ORDER — HYDROCHLOROTHIAZIDE 12.5 MG PO CAPS: 12.5000 mg | ORAL_CAPSULE | Freq: Every day | ORAL | 3 refills | Status: DC

## 2019-10-25 MED ORDER — AMLODIPINE BESYLATE 2.5 MG PO TABS: 2.5000 mg | ORAL_TABLET | Freq: Every day | ORAL | 3 refills | Status: DC

## 2019-10-25 MED ORDER — NORETHINDRONE 0.35 MG PO TABS: 1.0000 | ORAL_TABLET | Freq: Every day | ORAL | 4 refills | Status: DC

## 2019-10-25 NOTE — Addendum Note (Signed)
Addended by: Derrek Monaco A on: 10/25/2019 09:26 AM   Modules accepted: Orders

## 2019-10-25 NOTE — Progress Notes (Addendum)
Patient ID: Christie Griffin, female   DOB: 02/20/1971, 49 y.o.   MRN: [id number] History of Present Illness: [name] is a 49 year old white female,married, G1P1, in for a well woman gyn exam.Last pap was 10/17/18 was negative with negative HPV. Works in [employer] Daughter Christie Griffin getting married 04/20/21.   Current Medications, Allergies, Past Medical History, Past Surgical History, Family History and Social History were reviewed in [employer] record.     Review of Systems: Patient denies any headaches, hearing loss, fatigue, blurred vision, shortness of breath, chest pain, abdominal pain, problems with bowel movements, urination, or intercourse. No joint pain or mood swings. On Micronor, has irregular spotting    Physical Exam:BP 121/80 (BP Location: Left Arm, Patient Position: Sitting, Cuff Size: Normal)   Pulse 95   Ht 5\' 7"  (1.702 m)   Wt 190 lb (86.2 kg)   BMI 29.76 kg/m  General:  Well developed, well nourished, no acute distress Skin:  Warm and dry Neck:  Midline trachea, normal thyroid, good ROM, no lymphadenopathy Lungs; Clear to auscultation bilaterally Breast:  No dominant palpable mass, retraction, or nipple discharge Cardiovascular: Regular rate and rhythm Abdomen:  Soft, non tender, no hepatosplenomegaly Pelvic:  External genitalia is normal in appearance, no lesions.  The vagina is normal in appearance. Urethra has no lesions or masses. The cervix is bulbous.  Uterus is felt to be normal size, shape, and contour.  No adnexal masses or tenderness noted.Bladder is non tender, no masses felt. Rectal: Good sphincter tone, no polyps, or hemorrhoids felt.  Hemoccult negative. Extremities/musculoskeletal:  No swelling or varicosities noted, no clubbing or cyanosis Psych:  No mood changes, alert and cooperative,seems happy AA is 2 Fall risk is low PHQ 9 score is 0  Upstream - 10/25/19 0840      Pregnancy Intention Screening   Does the patient want to  become pregnant in the next year? No    Does the patient's partner want to become pregnant in the next year? No    Would the patient like to discuss contraceptive options today? Yes      Contraception Wrap Up   Current Method Oral Contraceptive    End Method Oral Contraceptive    Contraception Counseling Provided Yes         Examination chaperoned by Dwyane Dee LPN  Impression and Plan: 1. Encounter for well woman exam with routine gynecological exam Physical in 1 yar Pap in 2023 Had mammogram this morning  2. Encounter for screening fecal occult blood testing Colonoscopy at 50   3. Essential hypertension Continue meds,  Meds ordered this encounter  Medications  . amLODipine (NORVASC) 2.5 MG tablet    Sig: Take 1 tablet (2.5 mg total) by mouth daily.    Dispense:  90 tablet    Refill:  3    Order Specific Question:   Supervising Provider    Answer:   Elonda Husky, LUTHER H [[id number]]  . hydrochlorothiazide (MICROZIDE) 12.5 MG capsule    Sig: Take 1 capsule (12.5 mg total) by mouth daily.    Dispense:  90 capsule    Refill:  3    Order Specific Question:   Supervising Provider    Answer:   Elonda Husky, LUTHER H [[id number]]  . norethindrone (MICRONOR) 0.35 MG tablet    Sig: Take 1 tablet (0.35 mg total) by mouth daily.    Dispense:  84 tablet    Refill:  4    Order Specific Question:  Supervising Provider    Answer:   Tania Ade H [[id number]]    4. Encounter for menstrual regulation Refill Micronor   5. Irregular bleeding Continue Micronor

## 2019-11-07 ENCOUNTER — Other Ambulatory Visit: Payer: Self-pay

## 2019-11-07 ENCOUNTER — Telehealth: Payer: Self-pay

## 2019-11-07 MED ORDER — FLUTICASONE PROPIONATE 50 MCG/ACT NA SUSP: 2.0000 | Freq: Every day | NASAL | 0 refills | Status: DC

## 2019-11-07 NOTE — Telephone Encounter (Signed)
Patients pharmacy called to get a refill on her Flonase.  Please Advise  [place], [place]

## 2019-11-07 NOTE — Telephone Encounter (Signed)
Courtesy refill sent  

## 2019-11-10 ENCOUNTER — Encounter: Payer: Self-pay | Admitting: Allergy

## 2019-11-10 ENCOUNTER — Other Ambulatory Visit: Payer: Self-pay

## 2019-11-10 ENCOUNTER — Ambulatory Visit: Payer: PRIVATE HEALTH INSURANCE | Admitting: Allergy

## 2019-11-10 VITALS — BP 118/68 | HR 90 | Temp 98.2°F | Resp 18 | Ht 67.0 in | Wt 190.6 lb

## 2019-11-10 DIAGNOSIS — J45991 Cough variant asthma: Secondary | ICD-10-CM | POA: Diagnosis not present

## 2019-11-10 DIAGNOSIS — J3089 Other allergic rhinitis: Secondary | ICD-10-CM

## 2019-11-10 DIAGNOSIS — H1013 Acute atopic conjunctivitis, bilateral: Secondary | ICD-10-CM | POA: Diagnosis not present

## 2019-11-10 NOTE — Progress Notes (Signed)
Follow-up Note  RE: Christie Griffin MRN: [id number] DOB: March 26, 1971 Date of Office Visit: 11/10/2019   History of present illness: Christie Griffin is a 49 y.o. female presenting today for follow-up of cough variant asthma and allergic rhinitis with conjunctivitis. She was last seen in the office on 11/09/2018.  History and Physical obtained by Dr. Iona Beard, [facility] medicine resident and reviewed/edited by myself.    In May, she noticed some worsening of her sinus congestion and pressure that improved with her Flonase. Due to increased use of Flonase at that time she reports a couple of episodes of nose bleeding that resolved after stopping the Flonase. She also notes her blood pressure at this time was elevated and her Norvasc was increased. Since then she has been using nose sprays  2-3 days at time as needed as has not had further nose bleeding.   She states her asthma has been doing well. She switched to her Ruthe Mannan to Symbicort on April of this year as her insurance no longer covered her [facility]. She has observed no worsening of her symptoms with the switch. Since her last visit, she denies having asthma flares, and has not needed her albuterol. The last time she had prednisone and antibiotics for respiratory illness was around September of 2019.  She reports a couple of weeks in June and July where she had not been using her Symbicort regularly, however for the past few weeks she has been using Symbicort 160 mcg 2 puffs a day regularly. She continues to consistent with her use of Singular and Zyrtec.    Review of systems: Review of Systems  Constitutional: Negative.   HENT: Negative.   Eyes: Negative.   Respiratory: Negative.   Cardiovascular: Negative.   Gastrointestinal: Negative.   Musculoskeletal: Negative.   Skin: Negative.   Neurological: Negative.     All other systems negative unless noted above in HPI  Past medical/social/surgical/family history have  been reviewed and are unchanged unless specifically indicated below.  No changes  Medication List: Current Outpatient Medications  Medication Sig Dispense Refill  . albuterol (VENTOLIN HFA) 108 (90 Base) MCG/ACT inhaler Inhale 2 puffs into the lungs every 4 (four) hours as needed for wheezing or shortness of breath. 54 g 1  . amLODipine (NORVASC) 2.5 MG tablet Take 1 tablet (2.5 mg total) by mouth daily. 90 tablet 3  . budesonide-formoterol (SYMBICORT) 160-4.5 MCG/ACT inhaler Inhale 2 puffs into the lungs 2 (two) times daily. 1 Inhaler 5  . cetirizine (ZYRTEC) 10 MG tablet Take 1 tablet (10 mg total) by mouth daily. 90 tablet 1  . Docusate Sodium (COLACE PO) Take by mouth daily as needed.    . fluticasone (FLONASE) 50 MCG/ACT nasal spray Place 2 sprays into both nostrils daily. 16 g 0  . hydrochlorothiazide (MICROZIDE) 12.5 MG capsule Take 1 capsule (12.5 mg total) by mouth daily. 90 capsule 3  . montelukast (SINGULAIR) 10 MG tablet Take 1 tablet (10 mg total) by mouth at bedtime. 90 tablet 1  . norethindrone (MICRONOR) 0.35 MG tablet Take 1 tablet (0.35 mg total) by mouth daily. 84 tablet 4   No current facility-administered medications for this visit.     Known medication allergies: No Known Allergies   Physical examination: Blood pressure 118/68, pulse 90, temperature 98.2 F (36.8 C), resp. rate 18, height 5\' 7"  (1.702 m), weight 190 lb 9.6 oz (86.5 kg), SpO2 98 %.  General: Alert, interactive, in no acute distress. HEENT: TMs pearly  gray, turbinates non-edematous without discharge, post-pharynx non erythematous. Neck: Supple without lymphadenopathy. Lungs: Clear to auscultation without wheezing, rhonchi or rales. {no increased work of breathing. CV: Normal S1, S2 without murmurs. Abdomen: Nondistended, nontender. Skin: Warm and dry, without lesions or rashes. Extremities:  No clubbing, cyanosis or edema. Neuro:   Grossly intact.  Diagnositics/Labs:  Spirometry: FEV1: 2.2L  71%, FVC: 2.6L 66% predicted. This is stable from previous study.  Assessment and plan:   1. Cough Variant Asthma - continue Symbicort 112mcg 2 puff daily.  Increase to 2 puffs twice a day during respiratory illnesses or flares - have access to albuterol inhaler 2 puffs every 4-6 hours as needed for cough/wheeze/shortness of breath/chest tightness.  May use 15-20 minutes prior to activity.   Monitor frequency of use.   - Continue Singulair 10mg  (take at bedtime).  Asthma control goals:   Full participation in all desired activities (may need albuterol before activity)  Albuterol use two time or less a week on average (not counting use with activity)  Cough interfering with sleep two time or less a month  Oral steroids no more than once a year  2. Allergic rhinoconjunctivitis -Continue zyrtec and flonase as needed.    Follow-up 6-12 months or sooner if needed  I appreciate the opportunity to take part in Alivea's care. Please do not hesitate to contact me with questions.  Sincerely,   Prudy Feeler, MD Allergy/Immunology [facility]

## 2019-11-10 NOTE — Patient Instructions (Addendum)
1. Cough Variant Asthma - continue Symbicort 172mcg 2 puff daily.  Increase to 2 puffs twice a day during respiratory illnesses or flares - have access to albuterol inhaler 2 puffs every 4-6 hours as needed for cough/wheeze/shortness of breath/chest tightness.  May use 15-20 minutes prior to activity.   Monitor frequency of use.   - Continue Singulair 10mg  (take at bedtime).  Asthma control goals:   Full participation in all desired activities (may need albuterol before activity)  Albuterol use two time or less a week on average (not counting use with activity)  Cough interfering with sleep two time or less a month  Oral steroids no more than once a year  2. Allergic rhinoconjunctivitis -Continue zyrtec and flonase as needed.    Follow-up 6-12 months or sooner if needed

## 2020-01-31 ENCOUNTER — Other Ambulatory Visit: Payer: Self-pay | Admitting: Allergy

## 2020-02-01 ENCOUNTER — Other Ambulatory Visit: Payer: Self-pay

## 2020-02-01 DIAGNOSIS — J45991 Cough variant asthma: Secondary | ICD-10-CM

## 2020-02-01 DIAGNOSIS — J3089 Other allergic rhinitis: Secondary | ICD-10-CM

## 2020-02-01 MED ORDER — MONTELUKAST SODIUM 10 MG PO TABS: 10.0000 mg | ORAL_TABLET | Freq: Every day | ORAL | 0 refills | Status: DC

## 2020-04-16 ENCOUNTER — Telehealth: Payer: Self-pay | Admitting: Allergy

## 2020-04-16 NOTE — Telephone Encounter (Signed)
Is she the one doing to cleaning of the house?  If she is doing the cleaning then recommend she wear a N95 or equivablent mask with good seal around nose and mouth to help keep the dust and particles from breathing in and irritating her airway leading to cough.   Would recommend she increase her Symbicort to 2 puffs twice a day at this time.  As needed albuterol.  Continue daily singulair.   If not taking her antihistamine daily (zyrtec or similar) would start this daily as well.   If able to perform sinus rinse would do this also to help flush the dust and particles from the sinus/nasal passages.  At this time no antibiotic is necessary as very unlikely to be bacterial at this time and most likely due to inhalation of dust/particles causing reactive airway.

## 2020-04-16 NOTE — Telephone Encounter (Signed)
See previous message

## 2020-04-16 NOTE — Telephone Encounter (Signed)
Spoke to patient and she stated she has been taking all medications as prescribed and has already increased her Symbicort to 2 puffs twice daily since last Wednesday the 21st of December.  She is no longer cleaning the house but she still has the coughing issue which leads to coughing up thick yellow mucus, she states none of the medications are helping with the cough. Her daughter is getting married in a few days and she doesn't want to have a coughing spell in the middle of the wedding. She states a Vicks Vaper Rub Patch helps somewhat but not much. Is there anything else she take?

## 2020-04-16 NOTE — Telephone Encounter (Signed)
Okay thanks. If she is able to take a prednisone burst that should really help with her symptoms and resolve them.  Recommend prednisone 20 mg twice a day for 2 days, 20 mg daily for 2 days, 10 mg daily for 1 day and stop. If unable to take prednisone she can also try Mucinex DM to help with thinning the mucus and with cough suppression.

## 2020-04-16 NOTE — Telephone Encounter (Signed)
Patient called and would like to talk with dr padgett about cough that she has. She said they had close on their house and the new one has a lot of dust and had been close up for a while. She has a cough with yellow mucus yesterday but it is mostly clear. She doesn't want it to turn in to something worse. [facility] in [place]. [phone]

## 2020-04-16 NOTE — Telephone Encounter (Signed)
Patient stated that she has moving into her new house and it is dust due to it being closed up for some time now. Patient stated that she is coughing quite a bit and has started coughing up some yellow mucus yesterday. Patient is wondering if Dr. Delorse Lek could call her in something so that it doesn't turn into something else. Please advise.

## 2020-04-17 MED ORDER — PREDNISONE 10 MG PO TABS
ORAL_TABLET | ORAL | 0 refills | Status: DC
Start: 2020-04-17 — End: 2020-10-31

## 2020-04-17 NOTE — Telephone Encounter (Signed)
Spoke with patient, informed her of Dr. Randell Patient recommendation. Prescription has been sent to the requested pharmacy. Patient verbalized understanding and is very appreciative of Dr. Delorse Lek.

## 2020-04-17 NOTE — Addendum Note (Signed)
Addended by: Dub Mikes on: 04/17/2020 12:29 PM   Modules accepted: Orders

## 2020-04-25 ENCOUNTER — Other Ambulatory Visit: Payer: Self-pay

## 2020-04-25 ENCOUNTER — Other Ambulatory Visit: Payer: Self-pay | Admitting: Allergy

## 2020-04-25 DIAGNOSIS — J3089 Other allergic rhinitis: Secondary | ICD-10-CM

## 2020-04-25 DIAGNOSIS — J45991 Cough variant asthma: Secondary | ICD-10-CM

## 2020-04-25 MED ORDER — ALBUTEROL SULFATE HFA 108 (90 BASE) MCG/ACT IN AERS: 2.0000 | INHALATION_SPRAY | RESPIRATORY_TRACT | 0 refills | Status: DC | PRN

## 2020-04-25 NOTE — Telephone Encounter (Signed)
PT also need a new rx for albuterol inhaler sent to same pharmacy.

## 2020-07-24 ENCOUNTER — Other Ambulatory Visit (HOSPITAL_COMMUNITY): Payer: Self-pay | Admitting: Adult Health

## 2020-07-24 DIAGNOSIS — Z1231 Encounter for screening mammogram for malignant neoplasm of breast: Secondary | ICD-10-CM

## 2020-08-09 ENCOUNTER — Telehealth: Payer: Self-pay | Admitting: Allergy

## 2020-08-09 DIAGNOSIS — J45991 Cough variant asthma: Secondary | ICD-10-CM

## 2020-08-09 DIAGNOSIS — J3089 Other allergic rhinitis: Secondary | ICD-10-CM

## 2020-08-09 NOTE — Telephone Encounter (Signed)
Patient said she is having trouble getting her Symbicort filled. She said she has never had a problem before and her insurance has not changed. Pharmacy told her she needs a prior authorization.

## 2020-08-09 NOTE — Telephone Encounter (Signed)
Pa has been submitted thru cover my meds for symbicort

## 2020-08-14 NOTE — Telephone Encounter (Signed)
Pa results came back as no pa is required for symbicort no further action needed.

## 2020-09-04 MED ORDER — CETIRIZINE HCL 10 MG PO TABS: 10.0000 mg | ORAL_TABLET | Freq: Every day | ORAL | 1 refills | Status: DC

## 2020-09-04 MED ORDER — BUDESONIDE-FORMOTEROL FUMARATE 160-4.5 MCG/ACT IN AERO: 2.0000 | INHALATION_SPRAY | Freq: Two times a day (BID) | RESPIRATORY_TRACT | 5 refills | Status: DC

## 2020-09-04 MED ORDER — MONTELUKAST SODIUM 10 MG PO TABS: 10.0000 mg | ORAL_TABLET | Freq: Every day | ORAL | 1 refills | Status: DC

## 2020-09-04 MED ORDER — FLUTICASONE PROPIONATE 50 MCG/ACT NA SUSP: 2.0000 | Freq: Every day | NASAL | 5 refills | Status: DC

## 2020-09-04 MED ORDER — ALBUTEROL SULFATE HFA 108 (90 BASE) MCG/ACT IN AERS: 2.0000 | INHALATION_SPRAY | RESPIRATORY_TRACT | 0 refills | Status: DC | PRN

## 2020-09-04 NOTE — Addendum Note (Signed)
Addended by: Clovis Cao A on: 09/04/2020 01:36 PM   Modules accepted: Orders

## 2020-09-04 NOTE — Telephone Encounter (Signed)
Patient states she went to pick up Symbicort a couple weeks ago & was not able to have prescription filled. I advised patient that a prior authorization was sent in and had came back stating it was not needed. Patient is requesting refill for Symbicort sent in to pharmacy.   [place], [place], [place] [address]  Phone:  [phone] Fax:  [phone]   Best number to reach patient: [phone]. Patient would like call when refill is sent in to pharmacy.  Please advise.

## 2020-09-04 NOTE — Telephone Encounter (Signed)
Called and spoke to patient and informed her that I would send in her Symbicort.Patient asked that a refill of all her medications be sent in. I sent in all of her allergy and asthma medications to the [facility] pharmacy. Patient verbally expressed understanding.

## 2020-10-22 ENCOUNTER — Other Ambulatory Visit: Payer: Self-pay

## 2020-10-22 DIAGNOSIS — J3089 Other allergic rhinitis: Secondary | ICD-10-CM

## 2020-10-22 MED ORDER — CETIRIZINE HCL 10 MG PO TABS: 10.0000 mg | ORAL_TABLET | Freq: Every day | ORAL | 0 refills | Status: DC

## 2020-10-28 ENCOUNTER — Other Ambulatory Visit: Payer: Self-pay | Admitting: Adult Health

## 2020-10-31 ENCOUNTER — Other Ambulatory Visit: Payer: Self-pay

## 2020-10-31 ENCOUNTER — Encounter: Payer: Self-pay | Admitting: Adult Health

## 2020-10-31 ENCOUNTER — Ambulatory Visit (HOSPITAL_COMMUNITY)
Admission: RE | Admit: 2020-10-31 | Discharge: 2020-10-31 | Disposition: A | Discharge status: Home / Self Care | Payer: No Typology Code available for payment source | Source: Ambulatory Visit | Attending: Adult Health | Admitting: Adult Health

## 2020-10-31 ENCOUNTER — Ambulatory Visit (INDEPENDENT_AMBULATORY_CARE_PROVIDER_SITE_OTHER): Payer: No Typology Code available for payment source | Admitting: Adult Health

## 2020-10-31 VITALS — BP 134/94 | HR 102 | Ht 67.0 in | Wt 194.2 lb

## 2020-10-31 DIAGNOSIS — Z01419 Encounter for gynecological examination (general) (routine) without abnormal findings: Secondary | ICD-10-CM | POA: Diagnosis not present

## 2020-10-31 DIAGNOSIS — Z7689 Persons encountering health services in other specified circumstances: Secondary | ICD-10-CM

## 2020-10-31 DIAGNOSIS — N926 Irregular menstruation, unspecified: Secondary | ICD-10-CM

## 2020-10-31 DIAGNOSIS — Z1212 Encounter for screening for malignant neoplasm of rectum: Secondary | ICD-10-CM

## 2020-10-31 DIAGNOSIS — Z1231 Encounter for screening mammogram for malignant neoplasm of breast: Secondary | ICD-10-CM | POA: Diagnosis not present

## 2020-10-31 DIAGNOSIS — I10 Essential (primary) hypertension: Secondary | ICD-10-CM | POA: Diagnosis not present

## 2020-10-31 DIAGNOSIS — R7309 Other abnormal glucose: Secondary | ICD-10-CM | POA: Insufficient documentation

## 2020-10-31 DIAGNOSIS — Z1211 Encounter for screening for malignant neoplasm of colon: Secondary | ICD-10-CM | POA: Diagnosis not present

## 2020-10-31 DIAGNOSIS — D239 Other benign neoplasm of skin, unspecified: Secondary | ICD-10-CM

## 2020-10-31 LAB — HEMOCCULT GUIAC POC 1CARD (OFFICE): Fecal Occult Blood, POC: NEGATIVE

## 2020-10-31 MED ORDER — FLUCONAZOLE 150 MG PO TABS: ORAL_TABLET | ORAL | 2 refills | Status: DC

## 2020-10-31 MED ORDER — HYDROCHLOROTHIAZIDE 12.5 MG PO CAPS: 12.5000 mg | ORAL_CAPSULE | Freq: Every day | ORAL | 3 refills | Status: DC

## 2020-10-31 MED ORDER — AMLODIPINE BESYLATE 2.5 MG PO TABS: 2.5000 mg | ORAL_TABLET | Freq: Every day | ORAL | 3 refills | Status: DC

## 2020-10-31 MED ORDER — NORETHINDRONE 0.35 MG PO TABS: 1.0000 | ORAL_TABLET | Freq: Every day | ORAL | 4 refills | Status: DC

## 2020-10-31 NOTE — Progress Notes (Signed)
Patient ID: Christie Griffin, female   DOB: Jun 27, 1970, 50 y.o.   MRN: [id number] History of Present Illness:  Christie Griffin is a 50 year old white female, married in for a well woman gyn exam. She had mammogram today. She requests refill on diflucan. Lab Results  Component Value Date   DIAGPAP  10/17/2018    NEGATIVE FOR INTRAEPITHELIAL LESIONS OR MALIGNANCY. BENIGN REACTIVE/REPARATIVE CHANGES.   HPV NOT DETECTED 10/17/2018     Current Medications, Allergies, Past Medical History, Past Surgical History, Family History and Social History were reviewed in [employer] record.     Review of Systems:  Patient denies any headaches, hearing loss, fatigue, blurred vision, shortness of breath, chest pain, abdominal pain, problems with bowel movements, urination, or intercourse. No joint pain or mood swings.  Had missed 3 periods then spotting   Physical Exam:BP (!) 134/94 (BP Location: Right Arm, Patient Position: Sitting, Cuff Size: Normal)   Pulse (!) 102   Ht 5\' 7"  (1.702 m)   Wt 194 lb 3.2 oz (88.1 kg)   LMP 10/24/2020   BMI 30.42 kg/m   General:  Well developed, well nourished, no acute distress Skin:  Warm and dry Neck:  Midline trachea, normal thyroid, good ROM, no lymphadenopathy Lungs; Clear to auscultation bilaterally Breast:  No dominant palpable mass, retraction, or nipple discharge Cardiovascular: Regular rate and rhythm Abdomen:  Soft, non tender, no hepatosplenomegaly Pelvic:  External genitalia is normal in appearance, has angiokeratomas both labia.  The vagina is normal in appearance. Urethra has no lesions or masses. The cervix is bulbous.  Uterus is felt to be normal size, shape, and contour.  No adnexal masses or tenderness noted.Bladder is non tender, no masses felt. Rectal: Good sphincter tone, no polyps, or hemorrhoids felt.  Hemoccult negative. Extremities/musculoskeletal:  No swelling or varicosities noted, no clubbing or cyanosis Psych:  No  mood changes, alert and cooperative,seems happy AA is 2 Fall risk is low Depression screen Christie Griffin 2/9 10/31/2020 10/25/2019 10/17/2018  Decreased Interest 0 0 0  Down, Depressed, Hopeless 0 0 0  PHQ - 2 Score 0 0 0  Altered sleeping 0 0 -  Tired, decreased energy 0 0 -  Change in appetite 0 0 -  Feeling bad or failure about yourself  0 0 -  Trouble concentrating 0 0 -  Moving slowly or fidgety/restless 0 0 -  Suicidal thoughts 0 0 -  PHQ-9 Score 0 0 -  Difficult doing work/chores - Not difficult at all -    GAD 7 : Generalized Anxiety Score 10/31/2020 10/25/2019  Nervous, Anxious, on Edge 0 0  Control/stop worrying 0 0  Worry too much - different things 0 0  Trouble relaxing 0 0  Restless 0 0  Easily annoyed or irritable 0 0  Afraid - awful might happen 0 0  Total GAD 7 Score 0 0  Anxiety Difficulty - Not difficult at all      Upstream - 10/31/20 0840       Pregnancy Intention Screening   Does the patient want to become pregnant in the next year? No    Does the patient's partner want to become pregnant in the next year? No    Would the patient like to discuss contraceptive options today? No      Contraception Wrap Up   Current Method Oral Contraceptive    End Method Oral Contraceptive    Contraception Counseling Provided No  Examination without chaperoned, she gave verbal consent.   Impression and plan: 1. Encounter for well woman exam with routine gynecological exam Pap and physical in 1 year Check labs today Mammogram today and eyarly - CBC - Comprehensive metabolic panel - TSH - Lipid panel  2. Encounter for screening fecal occult blood testing  - POCT occult blood stool  3. Essential hypertension Continue microzide and  norvasc Meds ordered this encounter  Medications   hydrochlorothiazide (MICROZIDE) 12.5 MG capsule    Sig: Take 1 capsule (12.5 mg total) by mouth daily.    Dispense:  90 capsule    Refill:  3    Order Specific Question:    Supervising Provider    Answer:   Christie Griffin [[id number]]   norethindrone (MICRONOR) 0.35 MG tablet    Sig: Take 1 tablet (0.35 mg total) by mouth daily.    Dispense:  84 tablet    Refill:  4    Order Specific Question:   Supervising Provider    Answer:   Christie Griffin [[id number]]   fluconazole (DIFLUCAN) 150 MG tablet    Sig: Take 1 now and repeat 1 in 3 days if needed    Dispense:  2 tablet    Refill:  2    Order Specific Question:   Supervising Provider    Answer:   Christie Griffin [[id number]]   amLODipine (NORVASC) 2.5 MG tablet    Sig: Take 1 tablet (2.5 mg total) by mouth daily.    Dispense:  90 tablet    Refill:  3    Order Specific Question:   Supervising Provider    Answer:   Christie Griffin, Christie Griffin [[id number]]      4. Encounter for menstrual regulation Continue micronor  5. Irregular bleeding Continue micronor   6. Angiokeratoma   7. Screening for colorectal cancer  - Cologuard  8. Elevated hemoglobin A1c  - Hemoglobin A1c

## 2020-11-01 LAB — COMPREHENSIVE METABOLIC PANEL
ALT: 19 IU/L (ref 0–32)
AST: 15 IU/L (ref 0–40)
Albumin/Globulin Ratio: 1.7 (ref 1.2–2.2)
Albumin: 4.4 g/dL (ref 3.8–4.8)
Alkaline Phosphatase: 59 IU/L (ref 44–121)
BUN/Creatinine Ratio: 16 (ref 9–23)
BUN: 16 mg/dL (ref 6–24)
Bilirubin Total: 0.4 mg/dL (ref 0.0–1.2)
CO2: 22 mmol/L (ref 20–29)
Calcium: 9.1 mg/dL (ref 8.7–10.2)
Chloride: 102 mmol/L (ref 96–106)
Creatinine, Ser: 0.99 mg/dL (ref 0.57–1.00)
Globulin, Total: 2.6 g/dL (ref 1.5–4.5)
Glucose: 102 mg/dL — ABNORMAL HIGH (ref 65–99)
Potassium: 3.9 mmol/L (ref 3.5–5.2)
Sodium: 141 mmol/L (ref 134–144)
Total Protein: 7 g/dL (ref 6.0–8.5)
eGFR: 69 mL/min/{1.73_m2} (ref >59)

## 2020-11-01 LAB — LIPID PANEL
Chol/HDL Ratio: 3 ratio (ref 0.0–4.4)
Cholesterol, Total: 173 mg/dL (ref 100–199)
HDL: 57 mg/dL (ref >39)
LDL Chol Calc (NIH): 108 mg/dL — ABNORMAL HIGH (ref 0–99)
Triglycerides: 40 mg/dL (ref 0–149)
VLDL Cholesterol Cal: 8 mg/dL (ref 5–40)

## 2020-11-01 LAB — CBC
Hematocrit: 40 % (ref 34.0–46.6)
Hemoglobin: 12.8 g/dL (ref 11.1–15.9)
MCH: 27.7 pg (ref 26.6–33.0)
MCHC: 32 g/dL (ref 31.5–35.7)
MCV: 87 fL (ref 79–97)
Platelets: 308 10*3/uL (ref 150–450)
RBC: 4.62 x10E6/uL (ref 3.77–5.28)
RDW: 13.8 % (ref 11.7–15.4)
WBC: 5.6 10*3/uL (ref 3.4–10.8)

## 2020-11-01 LAB — HEMOGLOBIN A1C
Est. average glucose Bld gHb Est-mCnc: 117 mg/dL
Hgb A1c MFr Bld: 5.7 % — ABNORMAL HIGH (ref 4.8–5.6)

## 2020-11-01 LAB — TSH: TSH: 2.61 u[IU]/mL (ref 0.450–4.500)

## 2020-11-13 ENCOUNTER — Encounter: Payer: Self-pay | Admitting: Allergy

## 2020-11-13 ENCOUNTER — Other Ambulatory Visit: Payer: Self-pay

## 2020-11-13 ENCOUNTER — Ambulatory Visit (INDEPENDENT_AMBULATORY_CARE_PROVIDER_SITE_OTHER): Payer: No Typology Code available for payment source | Admitting: Allergy

## 2020-11-13 VITALS — BP 118/78 | HR 78 | Temp 98.4°F | Resp 16

## 2020-11-13 DIAGNOSIS — J45991 Cough variant asthma: Secondary | ICD-10-CM | POA: Diagnosis not present

## 2020-11-13 DIAGNOSIS — H1013 Acute atopic conjunctivitis, bilateral: Secondary | ICD-10-CM

## 2020-11-13 DIAGNOSIS — J3089 Other allergic rhinitis: Secondary | ICD-10-CM
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
## 2020-11-13 NOTE — Patient Instructions (Addendum)
1. Cough Variant Asthma - continue Symbicort 174mg 2 puff twice a day at this time.  Once symptoms are under control can decrease back down to daily dosing  - have access to albuterol inhaler 2 puffs every 4-6 hours as needed for cough/wheeze/shortness of breath/chest tightness.  May use 15-20 minutes prior to activity.   Monitor frequency of use.   - continue Singulair '10mg'$  at bedtime  - lung function today is lower today but did improve after albuterol use  Asthma control goals:  Full participation in all desired activities (may need albuterol before activity) Albuterol use two time or less a week on average (not counting use with activity) Cough interfering with sleep two time or less a month Oral steroids no more than once a year  2. Allergic rhinoconjunctivitis -Continue zyrtec and flonase as needed.     Follow-up 4-6 months or sooner if needed

## 2020-11-23 LAB — COLOGUARD: Cologuard: NEGATIVE

## 2021-04-02 ENCOUNTER — Telehealth: Payer: Self-pay | Admitting: Allergy

## 2021-04-02 NOTE — Telephone Encounter (Signed)
Called and spoke with the patient and advised of Dr. Jeralyn Ruths recommendation. She has been scheduled to come in tomorrow to be seen with Dr. Nelva Bush.

## 2021-04-02 NOTE — Telephone Encounter (Signed)
Would you like for the patient to come in and be evaluated Dr. Nelva Bush?

## 2021-04-02 NOTE — Telephone Encounter (Signed)
Patient stated Sunday she was at work and had a cough/tickle in her throat. Monday, her nose started running and she was sneezing all day, Tuesday, same as Monday. Today she is now coughing up tan/green mucous. She said the same thing happened to her last year. She feels she may have a sinus infection. She would like to talk to someone about this. Last seen 11/13/20.

## 2021-04-03 ENCOUNTER — Ambulatory Visit (INDEPENDENT_AMBULATORY_CARE_PROVIDER_SITE_OTHER): Payer: No Typology Code available for payment source | Admitting: Allergy

## 2021-04-03 ENCOUNTER — Other Ambulatory Visit: Payer: Self-pay

## 2021-04-03 ENCOUNTER — Encounter: Payer: Self-pay | Admitting: Allergy

## 2021-04-03 VITALS — BP 134/90 | HR 102 | Temp 98.0°F | Resp 20 | Ht 66.5 in | Wt 192.8 lb

## 2021-04-03 DIAGNOSIS — J4521 Mild intermittent asthma with (acute) exacerbation: Secondary | ICD-10-CM | POA: Diagnosis not present

## 2021-04-03 DIAGNOSIS — J0140 Acute pansinusitis, unspecified: Secondary | ICD-10-CM

## 2021-04-03 DIAGNOSIS — J3089 Other allergic rhinitis: Secondary | ICD-10-CM

## 2021-04-03 DIAGNOSIS — H1013 Acute atopic conjunctivitis, bilateral: Secondary | ICD-10-CM

## 2021-04-03 MED ORDER — AMOXICILLIN-POT CLAVULANATE 875-125 MG PO TABS: 1.0000 | ORAL_TABLET | Freq: Two times a day (BID) | ORAL | 0 refills | Status: AC

## 2021-04-03 NOTE — Addendum Note (Signed)
Addended by: Eloy End D on: 04/03/2021 05:00 PM   Modules accepted: Orders
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
## 2021-04-03 NOTE — Patient Instructions (Addendum)
Sinus infection with asthma exacerbation -symptoms not improving with OTC meds.   -will treat with Augmentin 875mg  1 tab twice a day x 10 days -take prednisone 5 day burst as directed -can take OTC meds like Mucinex DM to help with cough and thinning out mucus -drink plenty of water/fluids and get plenty of rest  Cough Variant Asthma - continue Symbicort 147mcg 2 puff twice a day at this time.  Once symptoms are under control can decrease back down to daily dosing  - have access to albuterol inhaler 2 puffs every 4-6 hours as needed for cough/wheeze/shortness of breath/chest tightness.  May use 15-20 minutes prior to activity.   Monitor frequency of use.   - continue Singulair 10mg  at bedtime   Asthma control goals:  Full participation in all desired activities (may need albuterol before activity) Albuterol use two time or less a week on average (not counting use with activity) Cough interfering with sleep two time or less a month Oral steroids no more than once a year  Allergic rhinoconjunctivitis -Continue zyrtec and flonase as needed.     Follow-up 6 months or sooner if needed

## 2021-04-09 ENCOUNTER — Telehealth: Payer: Self-pay | Admitting: Allergy

## 2021-04-09 ENCOUNTER — Other Ambulatory Visit: Payer: Self-pay | Admitting: *Deleted

## 2021-04-09 MED ORDER — BENZONATATE 100 MG PO CAPS: 100.0000 mg | ORAL_CAPSULE | Freq: Three times a day (TID) | ORAL | 0 refills | Status: AC | PRN

## 2021-04-09 NOTE — Telephone Encounter (Signed)
Christie Griffin called in and states she has taken all the medications as prescribed and she is feeling better and is breathing better.  Christie Griffin states she is still having productive coughing.  Christie Griffin states she has been taking Mucinex DM and nothing is working.  Christie Griffin wants to know if there is anything, even if it is OTC that will help with her productive cough?  Please advise.

## 2021-04-09 NOTE — Telephone Encounter (Signed)
Called and spoke with patient and informed of Dr. Jeralyn Ruths plan. Patient stated that she has tried Tessalon Pearls in the past and they didn't really help, but she was willing to try them again. I have sent in the prescription for her and advised to call back if she needs anything else. Patient verbalized understanding.

## 2021-04-15 ENCOUNTER — Other Ambulatory Visit: Payer: Self-pay | Admitting: Allergy

## 2021-04-15 DIAGNOSIS — J45991 Cough variant asthma: Secondary | ICD-10-CM

## 2021-04-15 DIAGNOSIS — J3089 Other allergic rhinitis: Secondary | ICD-10-CM

## 2021-05-15 ENCOUNTER — Ambulatory Visit: Payer: No Typology Code available for payment source | Admitting: Allergy

## 2021-07-28 ENCOUNTER — Other Ambulatory Visit: Payer: Self-pay | Admitting: Allergy

## 2021-09-17 ENCOUNTER — Other Ambulatory Visit (HOSPITAL_COMMUNITY): Payer: Self-pay | Admitting: Adult Health

## 2021-09-17 DIAGNOSIS — Z1231 Encounter for screening mammogram for malignant neoplasm of breast: Secondary | ICD-10-CM

## 2021-10-02 ENCOUNTER — Ambulatory Visit (INDEPENDENT_AMBULATORY_CARE_PROVIDER_SITE_OTHER): Payer: PRIVATE HEALTH INSURANCE | Admitting: Allergy

## 2021-10-02 ENCOUNTER — Encounter: Payer: Self-pay | Admitting: Allergy

## 2021-10-02 VITALS — BP 114/88 | HR 81 | Temp 98.2°F | Resp 14 | Ht 66.5 in | Wt 199.2 lb

## 2021-10-02 DIAGNOSIS — H1013 Acute atopic conjunctivitis, bilateral: Secondary | ICD-10-CM

## 2021-10-02 DIAGNOSIS — J4520 Mild intermittent asthma, uncomplicated: Secondary | ICD-10-CM

## 2021-10-02 DIAGNOSIS — J3089 Other allergic rhinitis: Secondary | ICD-10-CM | POA: Diagnosis not present

## 2021-10-02 DIAGNOSIS — J45991 Cough variant asthma: Secondary | ICD-10-CM

## 2021-10-02 MED ORDER — ALBUTEROL SULFATE HFA 108 (90 BASE) MCG/ACT IN AERS: 2.0000 | INHALATION_SPRAY | RESPIRATORY_TRACT | 0 refills | Status: DC | PRN

## 2021-10-02 MED ORDER — CETIRIZINE HCL 10 MG PO TABS: 10.0000 mg | ORAL_TABLET | Freq: Every day | ORAL | 1 refills | Status: DC

## 2021-10-02 MED ORDER — BUDESONIDE-FORMOTEROL FUMARATE 160-4.5 MCG/ACT IN AERO: INHALATION_SPRAY | RESPIRATORY_TRACT | 5 refills | Status: DC

## 2021-10-02 MED ORDER — MONTELUKAST SODIUM 10 MG PO TABS: 10.0000 mg | ORAL_TABLET | Freq: Every day | ORAL | 1 refills | Status: DC

## 2021-10-02 MED ORDER — FLUTICASONE PROPIONATE 50 MCG/ACT NA SUSP: 2.0000 | Freq: Every day | NASAL | 5 refills | Status: DC

## 2021-10-02 NOTE — Patient Instructions (Addendum)
Cough Variant Asthma - continue Symbicort 179mg 2 puff 1-2 times a day at this time.   - have access to albuterol inhaler 2 puffs every 4-6 hours as needed for cough/wheeze/shortness of breath/chest tightness.  May use 15-20 minutes prior to activity.   Monitor frequency of use.   - continue Singulair '10mg'$  at bedtime  - asthma biologic injectable agents discussed today and will initiate in future in control declines  Asthma control goals:  Full participation in all desired activities (may need albuterol before activity) Albuterol use two time or less a week on average (not counting use with activity) Cough interfering with sleep two time or less a month Oral steroids no more than once a year  Allergic rhinoconjunctivitis - continue zyrtec 1-2 tabs a day as needed - continue flonase as needed for nasal congestion/drainage - allergen immunotherapy (allergy shots) discussed today as possible long-term control of allergy symptoms    Follow-up 6 months or sooner if needed
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
## 2021-11-03 ENCOUNTER — Ambulatory Visit (HOSPITAL_COMMUNITY): Payer: No Typology Code available for payment source

## 2021-11-04 ENCOUNTER — Other Ambulatory Visit (HOSPITAL_COMMUNITY)
Admission: RE | Admit: 2021-11-04 | Discharge: 2021-11-04 | Disposition: A | Discharge status: Home / Self Care | Payer: PRIVATE HEALTH INSURANCE | Source: Ambulatory Visit | Attending: Adult Health | Admitting: Adult Health

## 2021-11-04 ENCOUNTER — Encounter: Payer: Self-pay | Admitting: Adult Health

## 2021-11-04 ENCOUNTER — Ambulatory Visit (INDEPENDENT_AMBULATORY_CARE_PROVIDER_SITE_OTHER): Payer: PRIVATE HEALTH INSURANCE | Admitting: Adult Health

## 2021-11-04 VITALS — BP 113/82 | HR 98 | Ht 67.0 in | Wt 196.0 lb

## 2021-11-04 DIAGNOSIS — Z01419 Encounter for gynecological examination (general) (routine) without abnormal findings: Secondary | ICD-10-CM | POA: Insufficient documentation

## 2021-11-04 DIAGNOSIS — Z7689 Persons encountering health services in other specified circumstances: Secondary | ICD-10-CM

## 2021-11-04 DIAGNOSIS — Z1211 Encounter for screening for malignant neoplasm of colon: Secondary | ICD-10-CM

## 2021-11-04 DIAGNOSIS — I10 Essential (primary) hypertension: Secondary | ICD-10-CM | POA: Diagnosis not present

## 2021-11-04 DIAGNOSIS — D239 Other benign neoplasm of skin, unspecified: Secondary | ICD-10-CM

## 2021-11-04 DIAGNOSIS — N951 Menopausal and female climacteric states: Secondary | ICD-10-CM

## 2021-11-04 DIAGNOSIS — R7309 Other abnormal glucose: Secondary | ICD-10-CM

## 2021-11-04 LAB — HEMOCCULT GUIAC POC 1CARD (OFFICE): Fecal Occult Blood, POC: NEGATIVE

## 2021-11-04 MED ORDER — AMLODIPINE BESYLATE 2.5 MG PO TABS: 2.5000 mg | ORAL_TABLET | Freq: Every day | ORAL | 3 refills | Status: DC

## 2021-11-04 MED ORDER — NORETHINDRONE 0.35 MG PO TABS: 1.0000 | ORAL_TABLET | Freq: Every day | ORAL | 4 refills | Status: DC

## 2021-11-04 MED ORDER — HYDROCHLOROTHIAZIDE 12.5 MG PO CAPS: 12.5000 mg | ORAL_CAPSULE | Freq: Every day | ORAL | 3 refills | Status: DC

## 2021-11-04 NOTE — Progress Notes (Signed)
Patient ID: Christie Griffin, female   DOB: 04/27/1970, 51 y.o.   MRN: [id number] History of Present Illness: Walker is a 51 year old white female, married, G1P1, in for a well woman gyn exam and pap. 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 She is having some mood changes and is trying to diet and felt bloated one day this weekend..    Current Medications, Allergies, Past Medical History, Past Surgical History, Family History and Social History were reviewed in [employer] record.     Review of Systems: Patient denies any headaches, hearing loss, fatigue, blurred vision, shortness of breath, chest pain, abdominal pain, problems with bowel movements, urination, or intercourse. No joint pain or mood swings.  See HPI for positives.   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  The vagina is normal in appearance. Urethra has no lesions or masses. The cervix is bulbous.Pap with HR HPV genotyping performed.  Uterus is felt to be normal size, shape, and contour.  No adnexal masses or tenderness noted.Bladder is non tender, no masses felt. Rectal: Good sphincter tone, no polyps, or hemorrhoids felt.  Hemoccult negative. Extremities/musculoskeletal:  No swelling or varicosities noted, no clubbing or cyanosis Psych:  No mood changes, alert and cooperative,seems happy AA is 1 Fall risk is low    11/04/2021    8:38  AM 10/31/2020    8:41 AM 10/25/2019    8:41 AM  Depression screen PHQ 2/9  Decreased Interest 0 0 0  Down, Depressed, Hopeless 0 0 0  PHQ - 2 Score 0 0 0  Altered sleeping 0 0 0  Tired, decreased energy 0 0 0  Change in appetite 0 0 0  Feeling bad or failure about yourself  0 0 0  Trouble concentrating 0 0 0  Moving slowly or fidgety/restless 0 0 0  Suicidal thoughts 0 0 0  PHQ-9 Score 0 0 0  Difficult doing work/chores   Not difficult at all       11/04/2021    8:46 AM 10/31/2020    8:41 AM 10/25/2019    8:41 AM  GAD 7 : Generalized Anxiety Score  Nervous, Anxious, on Edge 0 0 0  Control/stop worrying 0 0 0  Worry too much - different things 0 0 0  Trouble relaxing 0 0 0  Restless 0 0 0  Easily annoyed or irritable 3 0 0  Afraid - awful might happen 0 0 0  Total GAD 7 Score 3 0 0  Anxiety Difficulty   Not difficult at all    Upstream - 11/04/21 0845       Pregnancy Intention Screening   Does the patient want to become pregnant in the next year? No    Does the patient's partner want to become pregnant in the next year? No    Would the patient like to discuss contraceptive options today? No  Contraception Wrap Up   Current Method Oral Contraceptive;Female Condom    End Method Oral Contraceptive;Female Condom              Examination chaperoned by Levy Pupa LPN   Impression and Plan: 1. Encounter for gynecological examination with Papanicolaou smear of cervix Pap sent Pap in 3 years if normal Physical in 1 year Will check labs  - Cytology - PAP( [facility]) - CBC - Comprehensive metabolic panel - TSH - Lipid panel She has mammogram tomorrow [facility]  2. Encounter for screening fecal occult blood testing Hemoccult was negative  - POCT occult blood stool  3. Essential hypertension Will continue Norvasc and Microzide, BP is good  Meds ordered this encounter  Medications   amLODipine (NORVASC) 2.5 MG tablet    Sig: Take 1 tablet (2.5 mg total) by mouth  daily.    Dispense:  90 tablet    Refill:  3    Order Specific Question:   Supervising Provider    Answer:   Tania Ade H [[id number]]   norethindrone (MICRONOR) 0.35 MG tablet    Sig: Take 1 tablet (0.35 mg total) by mouth daily.    Dispense:  84 tablet    Refill:  4    Order Specific Question:   Supervising Provider    Answer:   Tania Ade H [[id number]]   hydrochlorothiazide (MICROZIDE) 12.5 MG capsule    Sig: Take 1 capsule (12.5 mg total) by mouth daily.    Dispense:  90 capsule    Refill:  3    Order Specific Question:   Supervising Provider    Answer:   Elonda Husky, LUTHER H [[id number]]    4. Encounter for menstrual regulation Will refill micronor  5. Angiokeratoma  6. Peri-menopause  7. Elevated hemoglobin A1c - Hemoglobin A1c

## 2021-11-05 ENCOUNTER — Ambulatory Visit (HOSPITAL_COMMUNITY)
Admission: RE | Admit: 2021-11-05 | Discharge: 2021-11-05 | Disposition: A | Discharge status: Home / Self Care | Payer: PRIVATE HEALTH INSURANCE | Source: Ambulatory Visit | Attending: Adult Health | Admitting: Adult Health

## 2021-11-05 DIAGNOSIS — Z1231 Encounter for screening mammogram for malignant neoplasm of breast: Secondary | ICD-10-CM | POA: Insufficient documentation

## 2021-11-05 LAB — COMPREHENSIVE METABOLIC PANEL
ALT: 16 IU/L (ref 0–32)
AST: 17 IU/L (ref 0–40)
Albumin/Globulin Ratio: 1.7 (ref 1.2–2.2)
Albumin: 4.3 g/dL (ref 3.8–4.9)
Alkaline Phosphatase: 44 IU/L (ref 44–121)
BUN/Creatinine Ratio: 15 (ref 9–23)
BUN: 14 mg/dL (ref 6–24)
Bilirubin Total: 0.5 mg/dL (ref 0.0–1.2)
CO2: 20 mmol/L (ref 20–29)
Calcium: 9.2 mg/dL (ref 8.7–10.2)
Chloride: 104 mmol/L (ref 96–106)
Creatinine, Ser: 0.94 mg/dL (ref 0.57–1.00)
Globulin, Total: 2.5 g/dL (ref 1.5–4.5)
Glucose: 97 mg/dL (ref 70–99)
Potassium: 4 mmol/L (ref 3.5–5.2)
Sodium: 141 mmol/L (ref 134–144)
Total Protein: 6.8 g/dL (ref 6.0–8.5)
eGFR: 73 mL/min/{1.73_m2} (ref >59)

## 2021-11-05 LAB — CBC
Hematocrit: 40 % (ref 34.0–46.6)
Hemoglobin: 12.9 g/dL (ref 11.1–15.9)
MCH: 27.7 pg (ref 26.6–33.0)
MCHC: 32.3 g/dL (ref 31.5–35.7)
MCV: 86 fL (ref 79–97)
Platelets: 310 10*3/uL (ref 150–450)
RBC: 4.66 x10E6/uL (ref 3.77–5.28)
RDW: 13.9 % (ref 11.7–15.4)
WBC: 5.7 10*3/uL (ref 3.4–10.8)

## 2021-11-05 LAB — LIPID PANEL
Chol/HDL Ratio: 3.3 ratio (ref 0.0–4.4)
Cholesterol, Total: 160 mg/dL (ref 100–199)
HDL: 48 mg/dL (ref >39)
LDL Chol Calc (NIH): 102 mg/dL — ABNORMAL HIGH (ref 0–99)
Triglycerides: 49 mg/dL (ref 0–149)
VLDL Cholesterol Cal: 10 mg/dL (ref 5–40)

## 2021-11-05 LAB — CYTOLOGY - PAP
Comment: NEGATIVE
Diagnosis: NEGATIVE
High risk HPV: NEGATIVE

## 2021-11-05 LAB — TSH: TSH: 1.53 u[IU]/mL (ref 0.450–4.500)

## 2021-11-05 LAB — HEMOGLOBIN A1C
Est. average glucose Bld gHb Est-mCnc: 117 mg/dL
Hgb A1c MFr Bld: 5.7 % — ABNORMAL HIGH (ref 4.8–5.6)

## 2021-12-17 ENCOUNTER — Encounter: Payer: Self-pay | Admitting: Family

## 2021-12-17 ENCOUNTER — Telehealth: Payer: Self-pay

## 2021-12-17 ENCOUNTER — Ambulatory Visit (INDEPENDENT_AMBULATORY_CARE_PROVIDER_SITE_OTHER): Payer: PRIVATE HEALTH INSURANCE | Admitting: Family

## 2021-12-17 ENCOUNTER — Other Ambulatory Visit: Payer: Self-pay

## 2021-12-17 VITALS — BP 112/82 | HR 99 | Temp 98.8°F | Resp 18

## 2021-12-17 DIAGNOSIS — J45991 Cough variant asthma: Secondary | ICD-10-CM

## 2021-12-17 DIAGNOSIS — H1010 Acute atopic conjunctivitis, unspecified eye: Secondary | ICD-10-CM

## 2021-12-17 DIAGNOSIS — J309 Allergic rhinitis, unspecified: Secondary | ICD-10-CM | POA: Diagnosis not present

## 2021-12-17 DIAGNOSIS — J019 Acute sinusitis, unspecified: Secondary | ICD-10-CM | POA: Diagnosis not present

## 2021-12-17 DIAGNOSIS — H1013 Acute atopic conjunctivitis, bilateral: Secondary | ICD-10-CM

## 2021-12-17 MED ORDER — AMOXICILLIN-POT CLAVULANATE 875-125 MG PO TABS
1.0000 | ORAL_TABLET | Freq: Two times a day (BID) | ORAL | 0 refills | Status: DC
Start: 2021-12-17 — End: 2022-02-02

## 2021-12-17 NOTE — Progress Notes (Signed)
[address] [place] [address] Dept: [phone]  FOLLOW UP NOTE  Patient ID: Christie Griffin, female    DOB: 1970-12-27  Age: 51 y.o. MRN: [id number] Date of Office Visit: 12/17/2021  Assessment  Chief Complaint: Sinus Problem  HPI Christie Griffin is a 51 year old female who presents today for an acute visit of possible sinus infection.  She was last seen October 02, 2021 by Dr. Nelva Bush for cough variant asthma and allergic rhinoconjunctivitis.  She denies any new diagnosis or surgery since her last office visit.  Allergic rhinoconjunctivitis:  She reports that approximately a week ago she began having left ear pain throughout the week.  Then this last Friday her left ear pain became worse.  Saturday  she woke up feeling terrible, had bilateral ear pain, and sinus pressure over her eyes.  She also started wheezing on Saturday.  And on Sunday she started having a productive cough with thick yellow-white sputum.  The cough is still present but she feels like the sputum is more white now.  She also has rhinorrhea that is white and yellow.  She denies fever.  Also this past Friday she did get tested for COVID-19 and reports that it was negative.  When her symptoms started she increased her Zyrtec to twice a day and started using  Flonase.  She also started taking Coricidin twice a day.  She feels like she has a sinus infection and is worried about having to travel soon and being sick.  She does feel like her allergies do cause her asthma to flare.  Discussed possibly starting allergy injections.  Cough variant asthma: She continues to take Symbicort, but she  increases her Symbicort to 2 puffs twice a day when her asthma starts flaring.  When her asthma is in good shape she uses her Symbicort 160/4.5 mcg 2 puffs in the morning and Singulair 10 mg at night.  She reports productive cough with thick white-yellow sputum, wheezing, tightness in chest and shortness of  breath.  Tightness in chest and shortness of breath did  get better when she developed a productive cough.  She denies nocturnal awakenings due to breathing problems and fever or chills.   Drug Allergies:  No Known Allergies  Review of Systems: Review of Systems  Constitutional:  Negative for chills and fever.  HENT:  Positive for ear pain.        Reports postnasal drip, nasal congestion, and rhinorrhea  Eyes:        Denies itchy watery eyes  Respiratory:  Positive for cough, shortness of breath and wheezing.   Cardiovascular:  Negative for chest pain and palpitations.  Gastrointestinal:        Denies heartburn or reflux symptoms  Genitourinary:  Negative for frequency.  Skin:  Negative for itching and rash.  Neurological:  Negative for headaches.  Endo/Heme/Allergies:  Positive for environmental allergies.    Physical Exam: BP 112/82   Pulse 99   Temp 98.8 F (37.1 C) (Temporal)   Resp 18   SpO2 98%    Physical Exam Constitutional:      Appearance: Normal appearance.  HENT:     Head: Normocephalic and atraumatic.     Comments: Pharynx normal, eyes normal, ears normal, nose: Bilateral lower turbinates mildly edematous and slightly erythematous with no drainage noted    Right Ear: Tympanic membrane, ear canal and external ear normal.     Left Ear: Tympanic membrane, ear canal and external ear normal.  Mouth/Throat:     Mouth: Mucous membranes are moist.     Pharynx: Oropharynx is clear.  Eyes:     Conjunctiva/sclera: Conjunctivae normal.  Cardiovascular:     Rate and Rhythm: Regular rhythm. Tachycardia present.     Heart sounds: Normal heart sounds.  Pulmonary:     Effort: Pulmonary effort is normal.     Breath sounds: Normal breath sounds.     Comments: Lungs clear to auscultation Musculoskeletal:     Cervical back: Neck supple.  Skin:    General: Skin is warm.  Neurological:     Mental Status: She is alert and oriented to person, place, and time.   Psychiatric:        Mood and Affect: Mood normal.        Behavior: Behavior normal.        Thought Content: Thought content normal.        Judgment: Judgment normal.     Diagnostics: None  Assessment and Plan: 1. Acute non-recurrent sinusitis, unspecified location   2. Cough variant asthma   3. Allergic rhinoconjunctivitis     Meds ordered this encounter  Medications   amoxicillin-clavulanate (AUGMENTIN) 875-125 MG tablet    Sig: Take 1 tablet by mouth 2 (two) times daily.    Dispense:  14 tablet    Refill:  0    Patient Instructions  Cough Variant Asthma - continue Symbicort 166mg 2 puff 1-2 times a day at this time.   - have access to albuterol inhaler 2 puffs every 4-6 hours as needed for cough/wheeze/shortness of breath/chest tightness.  May use 15-20 minutes prior to activity.   Monitor frequency of use.   - continue Singulair '10mg'$  at bedtime  - asthma biologic injectable agents discussed today and will initiate in future in control declines  Asthma control goals:  Full participation in all desired activities (may need albuterol before activity) Albuterol use two time or less a week on average (not counting use with activity) Cough interfering with sleep two time or less a month Oral steroids no more than once a year  Allergic rhinoconjunctivitis - continue zyrtec 1-2 tabs a day as needed - continue flonase as needed for nasal congestion/drainage - allergen immunotherapy (allergy shots) discussed today as possible long-term control of allergy symptoms  Acute sinus infection Start Augmentin 875 mg taking 1 tablet twice a day for 7 days Prednisone 10 mg taking 1 tablet twice a day for 4 days, then on the fifth day take 1 tablet and stop Let uKoreaknow if this treatment plan is not working well for you.   follow-up appointment on January 21, 2022 with Dr. GErnst Bowler Return in about 5 weeks (around 01/21/2022), or if symptoms worsen or fail to improve.    Thank you  for the opportunity to care for this patient.  Please do not hesitate to contact me with questions.  CAlthea Charon FNP [facility]

## 2021-12-17 NOTE — Patient Instructions (Addendum)
Cough Variant Asthma - continue Symbicort 156mg 2 puff 1-2 times a day at this time.   - have access to albuterol inhaler 2 puffs every 4-6 hours as needed for cough/wheeze/shortness of breath/chest tightness.  May use 15-20 minutes prior to activity.   Monitor frequency of use.   - continue Singulair '10mg'$  at bedtime  - asthma biologic injectable agents discussed today and will initiate in future in control declines  Asthma control goals:  Full participation in all desired activities (may need albuterol before activity) Albuterol use two time or less a week on average (not counting use with activity) Cough interfering with sleep two time or less a month Oral steroids no more than once a year  Allergic rhinoconjunctivitis - continue zyrtec 1-2 tabs a day as needed - continue flonase as needed for nasal congestion/drainage - allergen immunotherapy (allergy shots) discussed today as possible long-term control of allergy symptoms  Acute sinus infection Start Augmentin 875 mg taking 1 tablet twice a day for 7 days Prednisone 10 mg taking 1 tablet twice a day for 4 days, then on the fifth day take 1 tablet and stop Let uKoreaknow if this treatment plan is not working well for you.   follow-up appointment on January 21, 2022 with Dr. GErnst Bowler

## 2021-12-17 NOTE — Telephone Encounter (Signed)
Patient called in - DOB verified- with complaints of the following sxs: ear pain - L; sinus press ; sneezing x last Friday; Saturday/Sunday/Monday: nasal congestion; cough - dry; some wheezing; Thursday: cough - yellowish white phlegm; Saturday: ear pain - bilateral.   Patient stated she has tried OTC Coricidin Cold/Flu since Saturday with some relief but is still coughing up the yellowish white phlegm. Patient denies fever, shortness of breath.  Appointment scheduled: Same Day @ 11:30 am with Chrissie in [place].   Patient verbalized understanding, no further questions.

## 2021-12-29 ENCOUNTER — Other Ambulatory Visit: Payer: Self-pay | Admitting: Adult Health

## 2022-01-21 ENCOUNTER — Encounter: Payer: Self-pay | Admitting: Allergy & Immunology

## 2022-01-21 ENCOUNTER — Ambulatory Visit (INDEPENDENT_AMBULATORY_CARE_PROVIDER_SITE_OTHER): Payer: PRIVATE HEALTH INSURANCE | Admitting: Allergy & Immunology

## 2022-01-21 VITALS — BP 110/80 | HR 77 | Temp 98.5°F | Resp 18 | Ht 67.5 in | Wt 200.2 lb

## 2022-01-21 DIAGNOSIS — J4540 Moderate persistent asthma, uncomplicated: Secondary | ICD-10-CM

## 2022-01-21 DIAGNOSIS — J3089 Other allergic rhinitis: Secondary | ICD-10-CM | POA: Diagnosis not present

## 2022-01-21 DIAGNOSIS — J302 Other seasonal allergic rhinitis: Secondary | ICD-10-CM

## 2022-01-21 NOTE — Progress Notes (Signed)
FOLLOW UP  Date of Service/Encounter:  01/21/22   Assessment:   Moderate persistent asthma, uncomplicated  Perennial and seasonal allergic rhinitis  Plan/Recommendations:   1. Moderate persistent asthma, uncomplicated - Lung testing looks amazing. - This is the best that it has ever looked.  - We are going to look into adding a biologic for better control while we add allergy shots as a long term curative process.  - Daily controller medication(s): Symbicort 160/4.31mg two puffs twice daily with spacer - Prior to physical activity: albuterol 2 puffs 10-15 minutes before physical activity. - Rescue medications: albuterol 4 puffs every 4-6 hours as needed - Asthma control goals:  * Full participation in all desired activities (may need albuterol before activity) * Albuterol use two time or less a week on average (not counting use with activity) * Cough interfering with sleep two time or less a month * Oral steroids no more than once a year * No hospitalizations  2. Seasonal and perennial allergic rhinitis - We are going to get environmental allergy testing via the blood. - Continue with Flonase one spray per nostril up to twice daily.  - We will get labs to check on your environmental allergies. - This will determine how many vials we will need for allergy shots. - We will call you in 1-2 weeks with the results of the testing.  3. Return in about 2 months (around 03/23/2022).    Subjective:   Christie Griffin a 51y.o. female presenting today for follow up of  Chief Complaint  Patient presents with   Asthma    Is feeling better than when she was last seen. With the weather changing. Wants to know if it is time to change symbicort   Allergic Rhinitis     Christie Griffin a history of the following: Patient Active Problem List   Diagnosis Date Noted   Peri-menopause 11/04/2021   Angiokeratoma 10/31/2020   Elevated hemoglobin A1c 10/31/2020    Irregular bleeding 10/25/2019   Encounter for screening fecal occult blood testing 10/25/2019   Encounter for well woman exam with routine gynecological exam 10/25/2019   Screening for colorectal cancer 10/17/2018   Encounter for gynecological examination with Papanicolaou smear of cervix 10/17/2018   Fibroids, intramural 11/11/2017   Hemorrhoidal skin tags 10/07/2016   Encounter for menstrual regulation 10/07/2016   Fecal occult blood test positive 10/07/2016   Cough variant asthma 11/29/2015   Allergic rhinoconjunctivitis 11/29/2015   Vitamin D deficiency 09/30/2015   Hemorrhoid 09/20/2013   Heavy menses 09/20/2013   Wheezing 09/20/2013   Essential hypertension 08/18/2012    History obtained from: chart review and patient.  LAaylahis a 51y.o. female presenting for a follow up visit.  She was last seen in August 2023 by CAlthea Charon one of our esteemed nurse practitioners.  At that time, we continued Symbicort 160 mcg 2 puffs twice daily as well as Singulair 10 mg daily.  We talked about starting an injectable biologic medication for her asthma.  For her allergic rhinitis, we continued Zyrtec as well as Flonase.  Allergy shots were discussed for long-term control.  She was treated for a sinus infection with Augmentin twice daily for 7 days in conjunction with a course of prednisone.  Since last visit, she has mostly done well.   Asthma/Respiratory Symptom History: She remains on her Symbicort  two puffs BID. She has been using this twice daily since December 2022 when she became rather  ill. She was previously doing only two puffs once daily. This was previously controlling her symptoms appropriately. She has needed prednisone   Allergic Rhinitis Symptom History: She has had two sinus infections in the last 12 months. This is a lot of antibiotics for her. She only gets  less than one antibiotic per year. She does have occasional nose bleeds with regular use of the Flonase. She has used  Atrovent in the past and she did not like it at all.   Otherwise, there have been no changes to her past medical history, surgical history, family history, or social history.    Review of Systems  Constitutional: Negative.  Negative for chills, fever, malaise/fatigue and weight loss.  HENT:  Positive for congestion and sinus pain. Negative for ear discharge and ear pain.   Eyes:  Negative for pain, discharge and redness.  Respiratory:  Positive for cough. Negative for sputum production, shortness of breath and wheezing.   Cardiovascular: Negative.  Negative for chest pain and palpitations.  Gastrointestinal:  Negative for abdominal pain, heartburn, nausea and vomiting.  Skin: Negative.  Negative for itching and rash.  Neurological:  Negative for dizziness and headaches.  Endo/Heme/Allergies:  Positive for environmental allergies. Does not bruise/bleed easily.       Objective:   Blood pressure 110/80, pulse 77, temperature 98.5 F (36.9 C), resp. rate 18, height 5' 7.5" (1.715 m), weight 200 lb 4 oz (90.8 kg), SpO2 97 %. Body mass index is 30.9 kg/m.    Physical Exam Vitals reviewed.  Constitutional:      Appearance: She is well-developed.  HENT:     Head: Normocephalic and atraumatic.     Right Ear: Tympanic membrane, ear canal and external ear normal.     Left Ear: Tympanic membrane, ear canal and external ear normal.     Nose: No nasal deformity, septal deviation, mucosal edema or rhinorrhea.     Right Turbinates: Enlarged, swollen and pale.     Left Turbinates: Enlarged, swollen and pale.     Right Sinus: No maxillary sinus tenderness or frontal sinus tenderness.     Left Sinus: No maxillary sinus tenderness or frontal sinus tenderness.     Mouth/Throat:     Mouth: Mucous membranes are not pale and not dry.     Pharynx: Uvula midline.  Eyes:     General: Lids are normal. Allergic shiner present.        Right eye: No discharge.        Left eye: No discharge.      Conjunctiva/sclera: Conjunctivae normal.     Right eye: Right conjunctiva is not injected. No chemosis.    Left eye: Left conjunctiva is not injected. No chemosis.    Pupils: Pupils are equal, round, and reactive to light.  Cardiovascular:     Rate and Rhythm: Normal rate and regular rhythm.     Heart sounds: Normal heart sounds.  Pulmonary:     Effort: Pulmonary effort is normal. No tachypnea, accessory muscle usage or respiratory distress.     Breath sounds: Normal breath sounds. No wheezing, rhonchi or rales.  Chest:     Chest wall: No tenderness.  Lymphadenopathy:     Cervical: No cervical adenopathy.  Skin:    General: Skin is warm.     Capillary Refill: Capillary refill takes less than 2 seconds.     Coloration: Skin is not pale.     Findings: No abrasion, erythema, petechiae or rash. Rash is not  papular, urticarial or vesicular.  Neurological:     Mental Status: She is alert.  Psychiatric:        Behavior: Behavior is cooperative.      Diagnostic studies:    Spirometry: results normal (FEV1: 1.95/88%, FVC: 2.31/85%, FEV1/FVC: 84%).    Spirometry consistent with normal pattern.    Allergy Studies: labs sent instead         Salvatore Marvel, MD  [facility]

## 2022-01-21 NOTE — Patient Instructions (Addendum)
1. Moderate persistent asthma, uncomplicated - Lung testing looks amazing. - This is the best that it has ever looked.  - Daily controller medication(s): Symbicort 160/4.49mg two puffs twice daily with spacer - Prior to physical activity: albuterol 2 puffs 10-15 minutes before physical activity. - Rescue medications: albuterol 4 puffs every 4-6 hours as needed - Asthma control goals:  * Full participation in all desired activities (may need albuterol before activity) * Albuterol use two time or less a week on average (not counting use with activity) * Cough interfering with sleep two time or less a month * Oral steroids no more than once a year * No hospitalizations  2. Seasonal and perennial allergic rhinitis - We are going to get environmental allergy testing via the blood. - Continue with Flonase one spray per nostril up to twice daily.  - We will get labs to check on your environmental allergies. - This will determine how many vials we will need for allergy shots. - We will call you in 1-2 weeks with the results of the testing.  3. Return in about 2 months (around 03/23/2022).    Please inform [place] any Emergency Department visits, hospitalizations, or changes in symptoms. Call [place] going to the ED for breathing or allergy symptoms since we might be able to fit you in for a sick visit. Feel free to contact u[place]anytime with any questions, problems, or concerns.  It was a pleasure to see you again today!  Websites that have reliable patient information: 1. American Academy of Asthma, Allergy, and Immunology: www.aaaai.org 2. Food Allergy Research and Education (FARE): foodallergy.org 3. Mothers of Asthmatics: http://www.asthmacommunitynetwork.org 4. American College of Allergy, Asthma, and Immunology: www.acaai.org   COVID-19 Vaccine Information can be found at: [email or web address] questions related to vaccine  distribution or appointments, please email [email or web address] or call [phone]   We realize that you might be concerned about having an allergic reaction to the COVID19 vaccines. To help with that concern, WE ARE OFFERING THE COVID19 VACCINES IN OUR OFFICE! Ask the front desk for dates!     "Like" u[place] Facebook and Instagram for our latest updates!      A healthy democracy works best when ANew [employer] Life Insuranceparticipate! Make sure you are registered to vote! If you have moved or changed any of your contact information, you will need to get this updated before voting!  In some cases, you MAY be able to register to vote online: [email or web address]    Allergy Shots   Allergies are the result of a chain reaction that starts in the immune system. Your immune system controls how your body defends itself. For instance, if you have an allergy to pollen, your immune system identifies pollen as an invader or allergen. Your immune system overreacts by producing antibodies called Immunoglobulin E (IgE). These antibodies travel to cells that release chemicals, causing an allergic reaction.  The concept behind allergy immunotherapy, whether it is received in the form of shots or tablets, is that the immune system can be desensitized to specific allergens that trigger allergy symptoms. Although it requires time and patience, the payback can be long-term relief.  How Do Allergy Shots Work?  Allergy shots work much like a vaccine. Your body responds to injected amounts of a particular allergen given in increasing doses, eventually developing a resistance and tolerance to it. Allergy shots can lead to decreased, minimal or no allergy symptoms.  There generally are two phases: build-up and maintenance. Build-up often  ranges from three to six months and involves receiving injections with increasing amounts of the allergens. The shots are typically given once or twice a week,  though more rapid build-up schedules are sometimes used.  The maintenance phase begins when the most effective dose is reached. This dose is different for each person, depending on how allergic you are and your response to the build-up injections. Once the maintenance dose is reached, there are longer periods between injections, typically two to four weeks.  Occasionally doctors give cortisone-type shots that can temporarily reduce allergy symptoms. These types of shots are different and should not be confused with allergy immunotherapy shots.  Who Can Be Treated with Allergy Shots?  Allergy shots may be a good treatment approach for people with allergic rhinitis (hay fever), allergic asthma, conjunctivitis (eye allergy) or stinging insect allergy.   Before deciding to begin allergy shots, you should consider:   The length of allergy season and the severity of your symptoms  Whether medications and/or changes to your environment can control your symptoms  Your desire to avoid long-term medication use  Time: allergy immunotherapy requires a major time commitment  Cost: may vary depending on your insurance coverage  Allergy shots for children age 68 and older are effective and often well tolerated. They might prevent the onset of new allergen sensitivities or the progression to asthma.  Allergy shots are not started on patients who are pregnant but can be continued on patients who become pregnant while receiving them. In some patients with other medical conditions or who take certain common medications, allergy shots may be of risk. It is important to mention other medications you talk to your allergist.   When Will I Feel Better?  Some may experience decreased allergy symptoms during the build-up phase. For others, it may take as long as 12 months on the maintenance dose. If there is no improvement after a year of maintenance, your allergist will discuss other treatment options with  you.  If you aren't responding to allergy shots, it may be because there is not enough dose of the allergen in your vaccine or there are missing allergens that were not identified during your allergy testing. Other reasons could be that there are high levels of the allergen in your environment or major exposure to non-allergic triggers like tobacco smoke.  What Is the Length of Treatment?  Once the maintenance dose is reached, allergy shots are generally continued for three to five years. The decision to stop should be discussed with your allergist at that time. Some people may experience a permanent reduction of allergy symptoms. Others may relapse and a longer course of allergy shots can be considered.  What Are the Possible Reactions?  The two types of adverse reactions that can occur with allergy shots are local and systemic. Common local reactions include very mild redness and swelling at the injection site, which can happen immediately or several hours after. A systemic reaction, which is less common, affects the entire body or a particular body system. They are usually mild and typically respond quickly to medications. Signs include increased allergy symptoms such as sneezing, a stuffy nose or hives.  Rarely, a serious systemic reaction called anaphylaxis can develop. Symptoms include swelling in the throat, wheezing, a feeling of tightness in the chest, nausea or dizziness. Most serious systemic reactions develop within 30 minutes of allergy shots. This is why it is strongly recommended you wait in your doctor's office for 30 minutes after your  injections. Your allergist is trained to watch for reactions, and his or her staff is trained and equipped with the proper medications to identify and treat them.  Who Should Administer Allergy Shots?  The preferred location for receiving shots is your prescribing allergist's office. Injections can sometimes be given at another facility where the  physician and staff are trained to recognize and treat reactions, and have received instructions by your prescribing allergist.

## 2022-01-23 LAB — ALLERGENS W/COMP RFLX AREA 2
Alternaria Alternata IgE: <0.1 kU/L
Aspergillus Fumigatus IgE: <0.1 kU/L
Bermuda Grass IgE: <0.1 kU/L
Cedar, Mountain IgE: <0.1 kU/L
Cladosporium Herbarum IgE: <0.1 kU/L
Cockroach, German IgE: <0.1 kU/L
Common Silver Birch IgE: <0.1 kU/L
Cottonwood IgE: <0.1 kU/L
D Farinae IgE: <0.1 kU/L
D Pteronyssinus IgE: <0.1 kU/L
E001-IgE Cat Dander: <0.1 kU/L
E005-IgE Dog Dander: <0.1 kU/L
Elm, American IgE: <0.1 kU/L
IgE (Immunoglobulin E), Serum: 122 IU/mL (ref 6–495)
Johnson Grass IgE: <0.1 kU/L
Maple/Box Elder IgE: <0.1 kU/L
Mouse Urine IgE: <0.1 kU/L
Oak, White IgE: <0.1 kU/L
Pecan, Hickory IgE: <0.1 kU/L
Penicillium Chrysogen IgE: <0.1 kU/L
Pigweed, Rough IgE: <0.1 kU/L
Ragweed, Short IgE: <0.1 kU/L
Sheep Sorrel IgE Qn: <0.1 kU/L
Timothy Grass IgE: <0.1 kU/L
White Mulberry IgE: <0.1 kU/L

## 2022-01-23 LAB — ALLERGEN PROFILE, MOLD
Aureobasidi Pullulans IgE: <0.1 kU/L
Candida Albicans IgE: 0.21 kU/L — AB
M009-IgE Fusarium proliferatum: <0.1 kU/L
M014-IgE Epicoccum purpur: <0.1 kU/L
Mucor Racemosus IgE: <0.1 kU/L
Phoma Betae IgE: <0.1 kU/L
Setomelanomma Rostrat: <0.1 kU/L
Stemphylium Herbarum IgE: <0.1 kU/L

## 2022-01-23 IMAGING — MG MM DIGITAL SCREENING BILAT W/ TOMO AND CAD
8 series · 8 of 24 positions shown · non-contrast
Comparison: Previous exam(s).

CLINICAL DATA: Screening.

EXAM:
DIGITAL SCREENING BILATERAL MAMMOGRAM WITH TOMOSYNTHESIS AND CAD
TECHNIQUE: Bilateral screening digital craniocaudal and mediolateral oblique
mammograms were obtained. Bilateral screening digital breast
tomosynthesis was performed. The images were evaluated with
computer-aided detection.

[R MLO synth-2D]
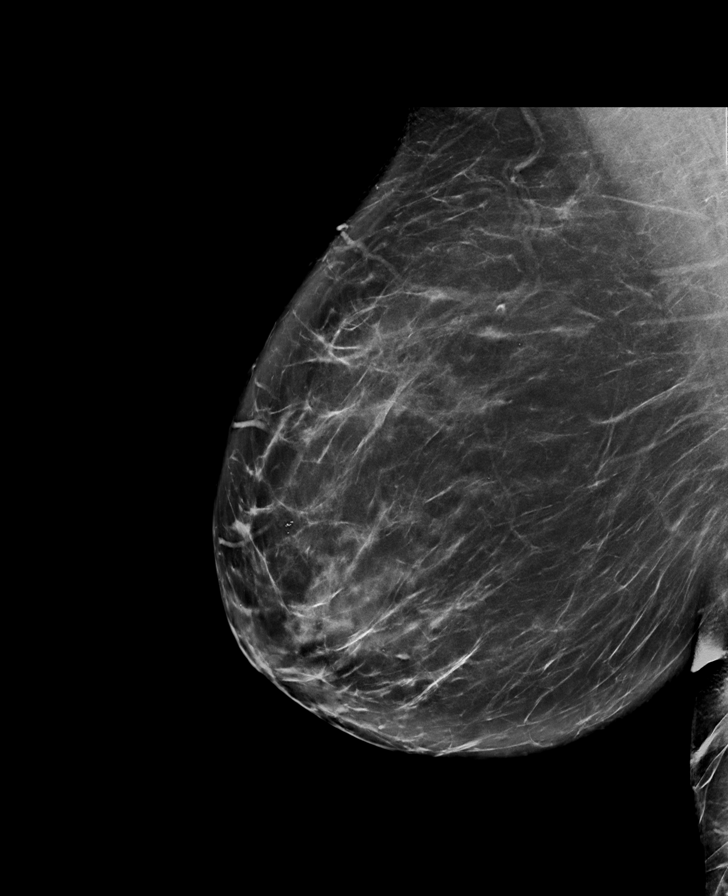

[R CC synth-2D]
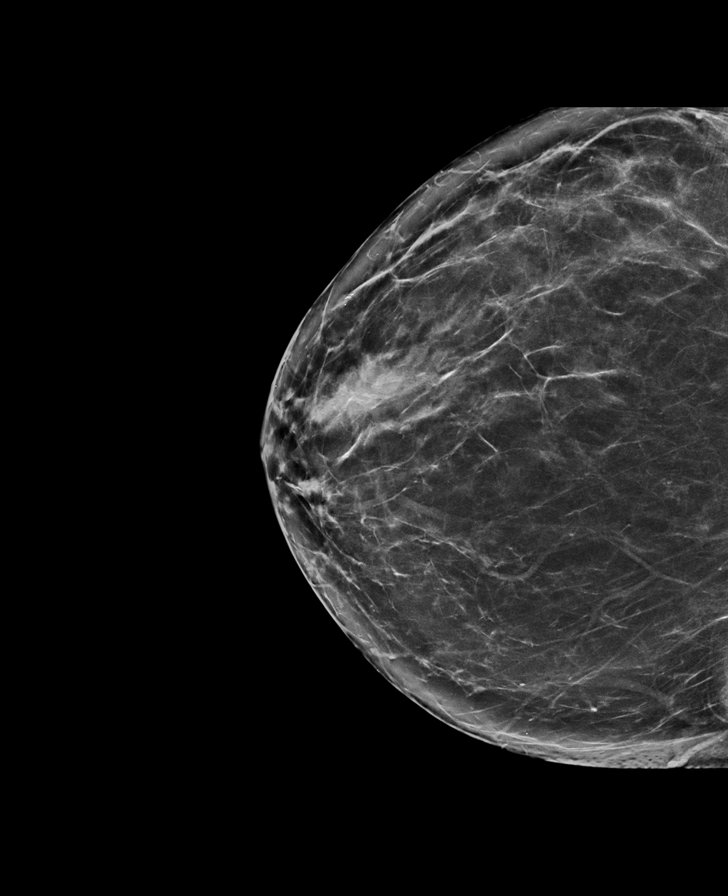

[L CC synth-2D]
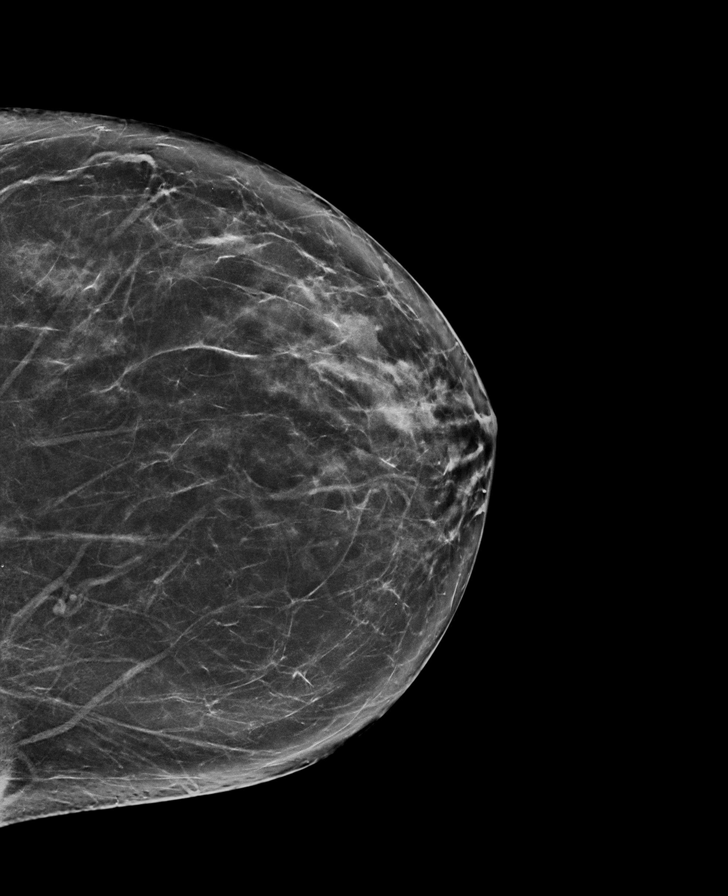

[L MLO synth-2D]
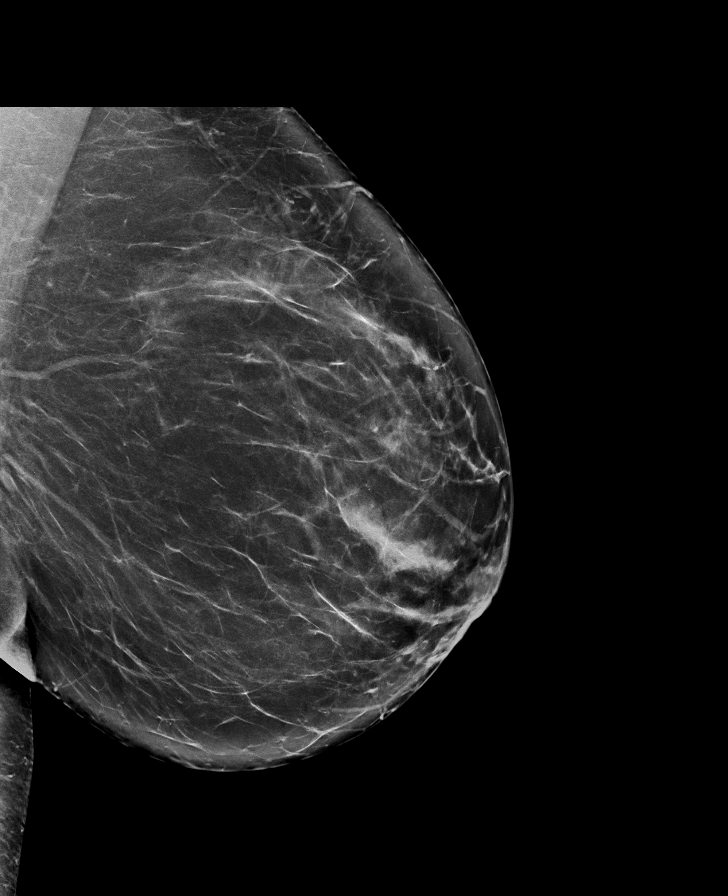

[L MLO tomo · tomo slice 45/88.0]
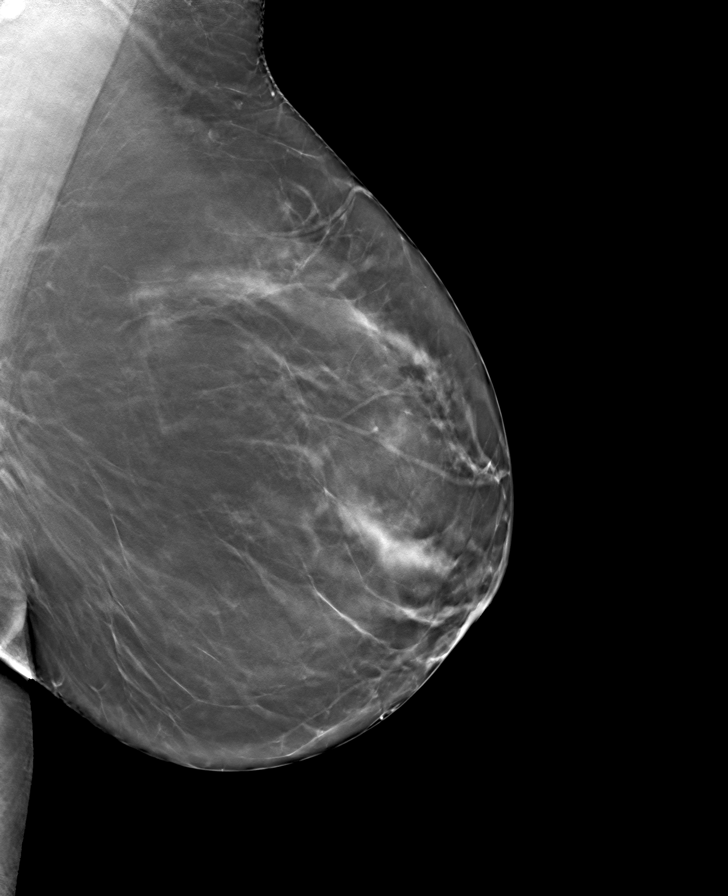

[L CC tomo · tomo slice 39/78.0]
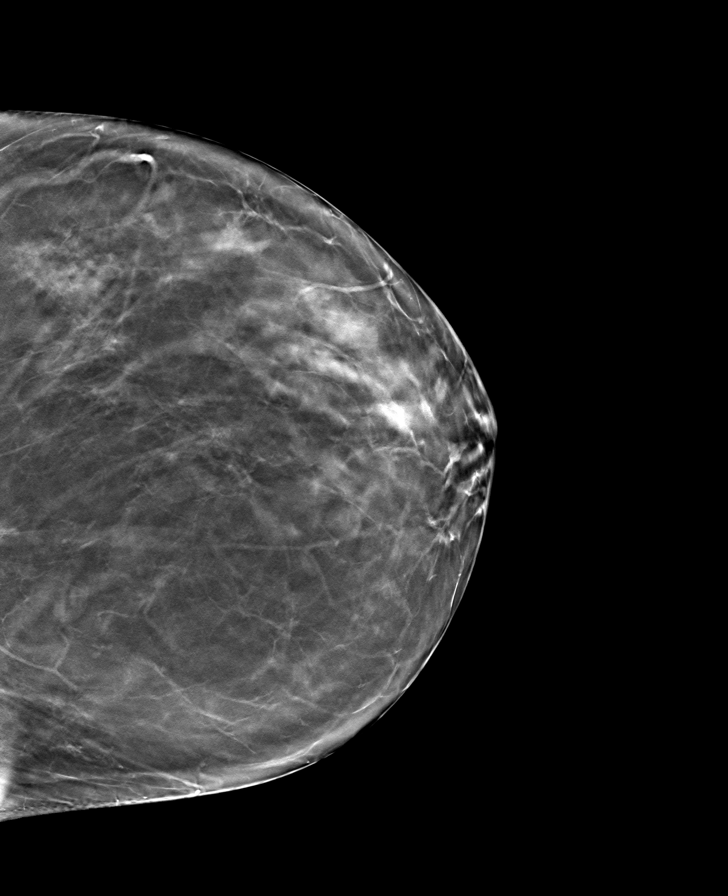

[R CC tomo · tomo slice 40/79.0]
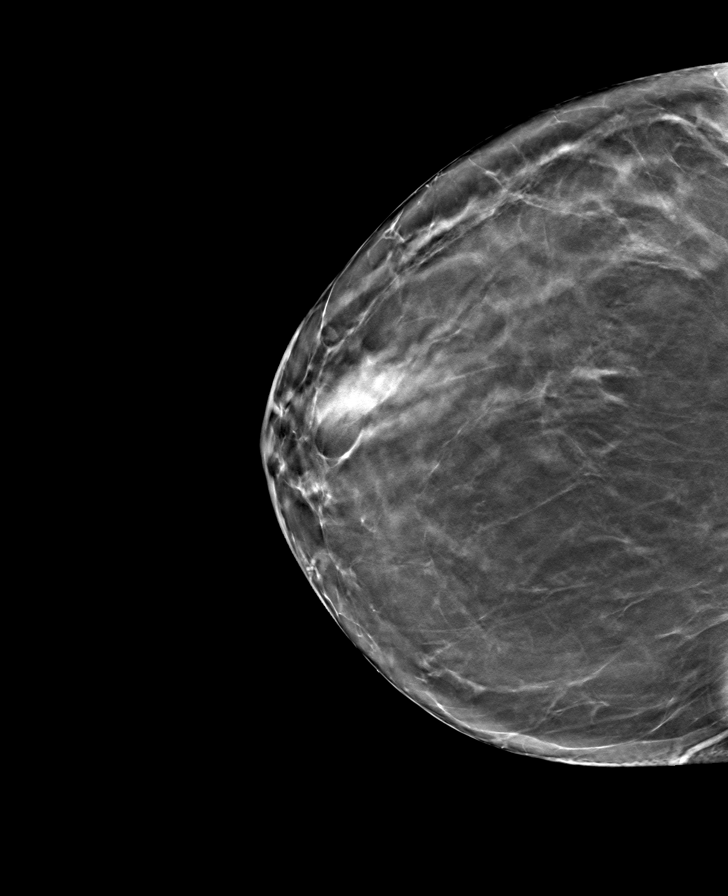

[R MLO tomo · tomo slice 47/93.0]
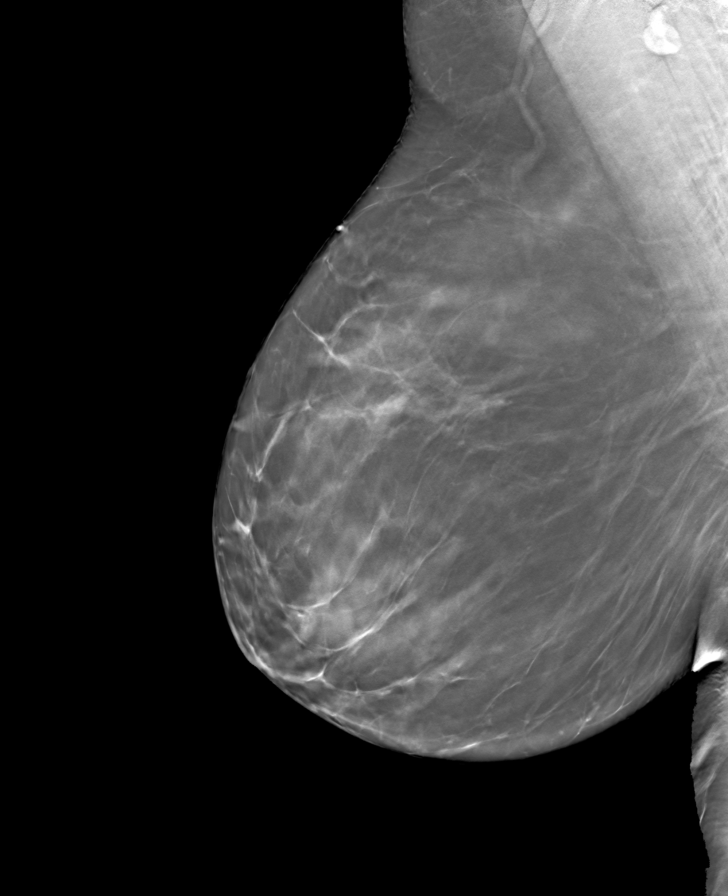

[8 of 24 positions shown; findings below may reference images not displayed]

ACR Breast Density Category b: There are scattered areas of
fibroglandular density.
FINDINGS: There are no findings suspicious for malignancy.
IMPRESSION: No mammographic evidence of malignancy. A result letter of this
screening mammogram will be mailed directly to the patient.

RECOMMENDATION:
Screening mammogram in one year. (Code:51-O-LD2)

BI-RADS CATEGORY  1: Negative.

## 2022-01-24 LAB — CBC WITH DIFFERENTIAL/PLATELET
Basophils Absolute: 0 10*3/uL (ref 0.0–0.2)
Basos: 0 %
EOS (ABSOLUTE): 0.2 10*3/uL (ref 0.0–0.4)
Eos: 3 %
Hematocrit: 37.8 % (ref 34.0–46.6)
Hemoglobin: 12.6 g/dL (ref 11.1–15.9)
Immature Grans (Abs): 0 10*3/uL (ref 0.0–0.1)
Immature Granulocytes: 0 %
Lymphocytes Absolute: 1.9 10*3/uL (ref 0.7–3.1)
Lymphs: 32 %
MCH: 28.5 pg (ref 26.6–33.0)
MCHC: 33.3 g/dL (ref 31.5–35.7)
MCV: 86 fL (ref 79–97)
Monocytes Absolute: 0.6 10*3/uL (ref 0.1–0.9)
Monocytes: 10 %
Neutrophils Absolute: 3.2 10*3/uL (ref 1.4–7.0)
Neutrophils: 55 %
Platelets: 328 10*3/uL (ref 150–450)
RBC: 4.42 x10E6/uL (ref 3.77–5.28)
RDW: 13.4 % (ref 11.7–15.4)
WBC: 5.9 10*3/uL (ref 3.4–10.8)

## 2022-01-24 LAB — IGE: IgE (Immunoglobulin E), Serum: 142 IU/mL (ref 6–495)

## 2022-01-26 ENCOUNTER — Telehealth: Payer: Self-pay

## 2022-01-26 NOTE — Telephone Encounter (Signed)
Patient is scheduled for 02/18/22 in [facility] to start allergy injections. Forms signed and sent to the scan center.

## 2022-01-27 NOTE — Telephone Encounter (Signed)
Routing to Dr. Nelva Bush since she has seen her the longest.   Salvatore Marvel, MD [facility] [place] [facility]

## 2022-01-29 NOTE — Telephone Encounter (Signed)
We reviewed the patient's chart with Dr. Nelva Bush. She has not been tested in November 2015. We did blood work that was largely unrevealing.   Let's push back her start injection appointment and get her in for skin testing. OK to overbook.  Salvatore Marvel, MD [facility] [place] [facility] [place]

## 2022-01-29 NOTE — Telephone Encounter (Signed)
Spoke with patient and she didn't realize the blood work didn't provide much information. Not sure if they were resulted or not. She is okay with doing allergy testing. She will give me a call back tomorrow with her calender.  I cx her start injection appt.

## 2022-02-02 ENCOUNTER — Encounter: Payer: Self-pay | Admitting: Internal Medicine

## 2022-02-02 ENCOUNTER — Ambulatory Visit (INDEPENDENT_AMBULATORY_CARE_PROVIDER_SITE_OTHER): Payer: PRIVATE HEALTH INSURANCE | Admitting: Internal Medicine

## 2022-02-02 VITALS — BP 122/70 | HR 88 | Temp 98.0°F | Resp 16 | Ht 67.0 in | Wt 198.8 lb

## 2022-02-02 DIAGNOSIS — J302 Other seasonal allergic rhinitis: Secondary | ICD-10-CM

## 2022-02-02 DIAGNOSIS — J45991 Cough variant asthma: Secondary | ICD-10-CM

## 2022-02-02 DIAGNOSIS — H1013 Acute atopic conjunctivitis, bilateral: Secondary | ICD-10-CM

## 2022-02-02 DIAGNOSIS — J3089 Other allergic rhinitis: Secondary | ICD-10-CM

## 2022-02-02 NOTE — Progress Notes (Signed)
FOLLOW UP Date of Service/Encounter:  02/02/22   Subjective:  Christie Griffin (DOB: 11-18-1970) is a 51 y.o. female who returns to the [facility] [place] on 02/02/2022 for follow up for skin testing.  History obtained from: chart review and patient.  She is here today for skin testing.   Denies any recent asthma flare ups.  She is still taking Symbicort 2 puffs BID.    She has held her anti-histamines for 3 days.  She underwent blood testing for aeroallergens and was only mildly reactive to candida.  She does report that in the past when she was allergy tested, she was positive to various things and negative to blood testing.   Past Medical History: Past Medical History:  Diagnosis Date   Anemia 2015   AUG 2015 Hb 13   Asthma    Fibroids, intramural 11/11/2017   Heavy menses 09/20/2013   Hemorrhoid 09/20/2013   Hypertension 08/18/2012   Vitamin D deficiency 09/30/2015   Wheezing 09/20/2013    Objective:  BP 122/70   Pulse 88   Temp 98 F (36.7 C)   Resp 16   Ht '5\' 7"'$  (1.702 m)   Wt 198 lb 12.8 oz (90.2 kg)   SpO2 96%   BMI 31.14 kg/m  Body mass index is 31.14 kg/m. Physical Exam: GEN: alert, well developed HEENT: clear conjunctiva, TM grey and translucent, nose with moderate inferior turbinate hypertrophy, pink nasal mucosa,  no rhinorrhea, + cobblestoning HEART: regular rate and rhythm, no murmur LUNGS: clear to auscultation bilaterally, no coughing, unlabored respiration SKIN: no rashes or lesions  Data Reviewed:  Labs: + sIgE to candida 01/2022  Skin Testing:  Skin prick testing was placed, which includes aeroallergens/foods, histamine control, and saline control.  Written consent was obtained prior to placing test.  We discussed risks including anaphylaxis. Patient tolerated procedure well.  Allergy testing results were read and interpreted by myself, documented by clinical staff. Adequate positive and negative control.  Positive results to:   Results discussed with patient/family.  Airborne Adult Perc - 02/02/22 1350     Time Antigen Placed 1350    Allergen Manufacturer Greer    Location Back    Number of Test 59    Panel 1 Select    2. Control-Histamine 1 mg/ml 3+    4. Pecos 2+    5. [place] 2+    6. Johnson Negative    7. [place] Blue Negative    8. Meadow Fescue 2+    9. Perennial Rye 2+    10. Sweet Vernal Negative    11. Timothy Negative    12. Cocklebur Negative    13. Burweed Marshelder Negative    14. Ragweed, short Negative    15. Ragweed, Giant Negative    16. Plantain,  English 2+    17. Lamb's Quarters 2+    18. Sheep Sorrell Negative    19. Rough Pigweed Negative    20. Marsh Elder, Rough Negative    21. Mugwort, Common 2+    22. Ash mix Negative    23. Birch mix Negative    24. Beech American Negative    25. Box, Elder 3+    26. Cedar, red Negative    27. Cottonwood, [place] Negative    28. Elm mix Negative    29. Hickory Negative    30. Maple mix Negative    31. Oak, [place] mix 2+    32. Pecan Pollen Negative  33. Pine mix Negative    34. Sycamore Eastern 2+    35. Baca, Black Pollen 2+    36. Alternaria alternata Negative    37. Cladosporium Herbarum Negative    38. Aspergillus mix Negative    39. Penicillium mix Negative    40. Bipolaris sorokiniana (Helminthosporium) Negative    41. Drechslera spicifera (Curvularia) Negative    42. Mucor plumbeus Negative    43. Fusarium moniliforme Negative    44. Aureobasidium pullulans (pullulara) 2+    45. Rhizopus oryzae Negative    46. Botrytis cinera Negative    47. Epicoccum nigrum Negative    48. Phoma betae Negative    49. Candida Albicans 2+    50. Trichophyton mentagrophytes Negative    51. Mite, D Farinae  5,000 AU/ml 2+    52. Mite, D Pteronyssinus  5,000 AU/ml Negative    53. Cat Hair 10,000 BAU/ml 3+    54.  Dog Epithelia 2+    55. Mixed Feathers 3+    56. Horse Epithelia Negative    57. Cockroach, German 2+    58.  Mouse 2+    59. Tobacco Leaf 2+             Intradermal - 02/02/22 1438     Time Antigen Placed 1438    Allergen Manufacturer Lavella Hammock    Location Arm    Number of Test 5    Intradermal Select              Assessment/Plan   Patient Instructions  1. Moderate persistent asthma, uncomplicated - Daily controller medication(s): Symbicort 160/4.80mg two puffs twice daily with spacer - Prior to physical activity: albuterol 2 puffs 10-15 minutes before physical activity. - Rescue medications: albuterol 4 puffs every 4-6 hours as needed - Asthma control goals:  * Full participation in all desired activities (may need albuterol before activity) * Albuterol use two time or less a week on average (not counting use with activity) * Cough interfering with sleep two time or less a month * Oral steroids no more than once a year * No hospitalizations  2. Seasonal and perennial allergic rhinitis - Continue with Flonase one spray per nostril up to twice daily.  - Restart Zyrtec '10mg'$  daily as needed. - I will let Dr. GErnst Bowlerknow about your results.    3. Return in about 8 weeks (around 03/30/2022).    ALLERGEN AVOIDANCE MEASURES  Dust Mites Use central air conditioning and heat; and change the filter monthly.  Pleated filters work better than mesh filters.  Electrostatic filters may also be used; wash the filter monthly.  Window air conditioners may be used, but do not clean the air as well as a central air conditioner.  Change or wash the filter monthly. Keep windows closed.  Do not use attic fans.   Encase the mattress, box springs and pillows with zippered, dust proof covers. Wash the bed linens in hot water weekly.   Remove carpet, especially from the bedroom. Remove stuffed animals, throw pillows, dust ruffles, heavy drapes and other items that collect dust from the bedroom. Do not use a humidifier.   Use wood, vinyl or leather furniture instead of cloth furniture in the  bedroom. Keep the indoor humidity at 30 - 40%.  Monitor with a humidity gauge.  Molds - Indoor avoidance Use air conditioning to reduce indoor humidity.  Do not use a humidifier. Keep indoor humidity at 30 - 40%.  Use a dehumidifier if  needed. In the bathroom use an exhaust fan or open a window after showering.  Wipe down damp surfaces after showering.  Clean bathrooms with a mold-killing solution (diluted bleach, or products like Tilex, etc) at least once a month. In the kitchen use an exhaust fan to remove steam from cooking.  Throw away spoiled foods immediately, and empty garbage daily.  Empty water pans below self-defrosting refrigerators frequently. Vent the clothes dryer to the outside. Limit indoor houseplants; mold grows in the dirt.  No houseplants in the bedroom. Remove carpet from the bedroom. Encase the mattress and box springs with a zippered encasing.  Molds - Outdoor avoidance Avoid being outside when the grass is being mowed, or the ground is tilled. Avoid playing in leaves, pine straw, hay, etc.  Dead plant materials contain mold. Avoid going into barns or grain storage areas. Remove leaves, clippings and compost from around the home.  Cockroach Limit spread of food around the house; especially keep food out of bedrooms. Keep food and garbage in closed containers with a tight lid.  Never leave food out in the kitchen.  Do not leave out pet food or dirty food bowls. Mop the kitchen floor and wash countertops at least once a week. Repair leaky pipes and faucets so there is no standing water to attract roaches. Plug up cracks in the house through which cockroaches can enter. Use bait stations and approved pesticides to reduce cockroach infestation. Pollen Avoidance Pollen levels are highest during the mid-day and afternoon.  Consider this when planning outdoor activities. Avoid being outside when the grass is being mowed, or wear a mask if the pollen-allergic person must be  the one to mow the grass. Keep the windows closed to keep pollen outside of the home. Use an air conditioner to filter the air. Take a shower, wash hair, and change clothing after working or playing outdoors during pollen season.       Return in about 8 weeks (around 03/30/2022). Harlon Flor, MD  [facility] [place] [facility] [place]

## 2022-02-02 NOTE — Patient Instructions (Addendum)
1. Moderate persistent asthma, uncomplicated - Daily controller medication(s): Symbicort 160/4.77mg two puffs twice daily with spacer - Prior to physical activity: albuterol 2 puffs 10-15 minutes before physical activity. - Rescue medications: albuterol 4 puffs every 4-6 hours as needed - Asthma control goals:  * Full participation in all desired activities (may need albuterol before activity) * Albuterol use two time or less a week on average (not counting use with activity) * Cough interfering with sleep two time or less a month * Oral steroids no more than once a year * No hospitalizations  2. Seasonal and perennial allergic rhinitis - Continue with Flonase one spray per nostril up to twice daily.  - Restart Zyrtec '10mg'$  daily as needed. - I will let Dr. GErnst Bowlerknow about your results.    3. Return in about 8 weeks (around 03/30/2022).    ALLERGEN AVOIDANCE MEASURES  Dust Mites Use central air conditioning and heat; and change the filter monthly.  Pleated filters work better than mesh filters.  Electrostatic filters may also be used; wash the filter monthly.  Window air conditioners may be used, but do not clean the air as well as a central air conditioner.  Change or wash the filter monthly. Keep windows closed.  Do not use attic fans.   Encase the mattress, box springs and pillows with zippered, dust proof covers. Wash the bed linens in hot water weekly.   Remove carpet, especially from the bedroom. Remove stuffed animals, throw pillows, dust ruffles, heavy drapes and other items that collect dust from the bedroom. Do not use a humidifier.   Use wood, vinyl or leather furniture instead of cloth furniture in the bedroom. Keep the indoor humidity at 30 - 40%.  Monitor with a humidity gauge.  Molds - Indoor avoidance Use air conditioning to reduce indoor humidity.  Do not use a humidifier. Keep indoor humidity at 30 - 40%.  Use a dehumidifier if needed. In the bathroom use an  exhaust fan or open a window after showering.  Wipe down damp surfaces after showering.  Clean bathrooms with a mold-killing solution (diluted bleach, or products like Tilex, etc) at least once a month. In the kitchen use an exhaust fan to remove steam from cooking.  Throw away spoiled foods immediately, and empty garbage daily.  Empty water pans below self-defrosting refrigerators frequently. Vent the clothes dryer to the outside. Limit indoor houseplants; mold grows in the dirt.  No houseplants in the bedroom. Remove carpet from the bedroom. Encase the mattress and box springs with a zippered encasing.  Molds - Outdoor avoidance Avoid being outside when the grass is being mowed, or the ground is tilled. Avoid playing in leaves, pine straw, hay, etc.  Dead plant materials contain mold. Avoid going into barns or grain storage areas. Remove leaves, clippings and compost from around the home.  Cockroach Limit spread of food around the house; especially keep food out of bedrooms. Keep food and garbage in closed containers with a tight lid.  Never leave food out in the kitchen.  Do not leave out pet food or dirty food bowls. Mop the kitchen floor and wash countertops at least once a week. Repair leaky pipes and faucets so there is no standing water to attract roaches. Plug up cracks in the house through which cockroaches can enter. Use bait stations and approved pesticides to reduce cockroach infestation. Pollen Avoidance Pollen levels are highest during the mid-day and afternoon.  Consider this when planning outdoor activities. Avoid  being outside when the grass is being mowed, or wear a mask if the pollen-allergic person must be the one to mow the grass. Keep the windows closed to keep pollen outside of the home. Use an air conditioner to filter the air. Take a shower, wash hair, and change clothing after working or playing outdoors during pollen season.

## 2022-02-03 ENCOUNTER — Encounter: Payer: Self-pay | Admitting: Allergy & Immunology

## 2022-02-03 DIAGNOSIS — J302 Other seasonal allergic rhinitis: Secondary | ICD-10-CM

## 2022-02-05 ENCOUNTER — Telehealth: Payer: Self-pay | Admitting: *Deleted

## 2022-02-05 MED ORDER — FASENRA PEN 30 MG/ML ~~LOC~~ SOAJ: 30.0000 mg | SUBCUTANEOUS | 9 refills | Status: DC

## 2022-02-05 NOTE — Telephone Encounter (Signed)
-----   Message from Valentina Shaggy, MD sent at 02/03/2022  7:56 AM EDT ----- MyChart message sent. Jaxin Fulfer - can we get Berna Bue approved for asthma?

## 2022-02-05 NOTE — Progress Notes (Signed)
NOT MADE UNTIL APPT IS SCHED. 

## 2022-02-05 NOTE — Telephone Encounter (Signed)
Called patient and advised approval, copay card and submit to [facility] for [place]. Instructions given on delivery, storage and initial injection in clinic with appt

## 2022-02-05 NOTE — Progress Notes (Signed)
Aeroallergen Immunotherapy   Ordering Provider: Dr. Salvatore Marvel   Patient Details  Name: Christie Griffin  MRN: [id number]  Date of Birth: Jul 12, 1970   Order 1 of 1   Vial Label: G/W/T/D/DM   0.3 ml (Volume)  BAU Concentration -- 7 Grass Mix* 100,000 ([address] [place], [place], [place], [place] Rye, RedTop, Sweet Vernal, Timothy)  0.2 ml (Volume)  1:20 Concentration -- Bahia  0.3 ml (Volume)  BAU Concentration -- Guatemala 10,000  0.5 ml (Volume)  1:20 Concentration -- Weed Mix*  0.2 ml (Volume)  1:20 Concentration -- Box Elder  0.3 ml (Volume)  1:10 Concentration -- Oak, [place] mix*  0.2 ml (Volume)  1:10 Concentration -- Sycamore Eastern*  0.2 ml (Volume)  1:20 Concentration -- Walnut, Black Pollen  0.8 ml (Volume)  1:10 Concentration -- Dog Epithelia  0.8 ml (Volume)   AU Concentration -- Mite Mix (DF 5,000 & DP 5,000)    3.8  ml Extract Subtotal  1.2  ml Diluent  5.0  ml Maintenance Total   Schedule:  B  Silver Vial (1:1,000,000):  Blue Vial (1:100,000): Schedule B (6 doses)  Yellow Vial (1:10,000): Schedule B (6 doses)  Green Vial (1:1,000): Schedule B (6 doses)  Red Vial (1:100): Schedule A (14 doses)   Special Instructions: Ok to get shot with Dupixent, if desired.

## 2022-02-06 NOTE — Telephone Encounter (Signed)
Thanks, Tammy!   Osha Rane, MD [facility]  

## 2022-02-16 ENCOUNTER — Ambulatory Visit: Payer: PRIVATE HEALTH INSURANCE | Admitting: *Deleted

## 2022-02-16 ENCOUNTER — Other Ambulatory Visit: Payer: Self-pay | Admitting: Adult Health

## 2022-02-16 DIAGNOSIS — J4550 Severe persistent asthma, uncomplicated: Secondary | ICD-10-CM

## 2022-02-16 MED ORDER — AMLODIPINE BESYLATE 2.5 MG PO TABS: 2.5000 mg | ORAL_TABLET | Freq: Every day | ORAL | 3 refills | Status: DC

## 2022-02-16 MED ORDER — BENRALIZUMAB 30 MG/ML ~~LOC~~ SOSY
30.0000 mg | PREFILLED_SYRINGE | Freq: Once | SUBCUTANEOUS | Status: AC
  Administered 2022-02-16: 30 mg via SUBCUTANEOUS

## 2022-02-16 NOTE — Progress Notes (Signed)
Immunotherapy   Patient Details  Name: Christie Griffin MRN: [id number] Date of Birth: 07/08/1970  02/16/2022  Christie Griffin started injections for  Fasenra    Frequency: Every 4 weeks x3, then every 8 weeks  Epi-Pen: Not Required  Consent signed and patient instructions given. Patient started Christie Griffin and self administered '30mg'$  into the Right Umbilicus, patient waited 30 minutes in office and did not experience any issues. Patient will continue to self administer her injections at home per [facility] schedule as directed.   Christie Griffin 02/16/2022, 11:22 AM

## 2022-02-16 NOTE — Progress Notes (Signed)
Refilled norvasc

## 2022-02-17 DIAGNOSIS — J3089 Other allergic rhinitis: Secondary | ICD-10-CM

## 2022-02-17 NOTE — Progress Notes (Signed)
VIALS EXP 02-18-23

## 2022-02-18 ENCOUNTER — Ambulatory Visit: Payer: PRIVATE HEALTH INSURANCE

## 2022-03-02 ENCOUNTER — Other Ambulatory Visit: Payer: Self-pay | Admitting: Allergy

## 2022-03-02 DIAGNOSIS — J45991 Cough variant asthma: Secondary | ICD-10-CM

## 2022-03-02 MED ORDER — BUDESONIDE-FORMOTEROL FUMARATE 160-4.5 MCG/ACT IN AERO: INHALATION_SPRAY | RESPIRATORY_TRACT | 1 refills | Status: DC

## 2022-03-02 MED ORDER — MONTELUKAST SODIUM 10 MG PO TABS: 10.0000 mg | ORAL_TABLET | Freq: Every day | ORAL | 1 refills | Status: DC

## 2022-03-02 MED ORDER — FLUTICASONE PROPIONATE 50 MCG/ACT NA SUSP: 1.0000 | Freq: Every day | NASAL | 1 refills | Status: DC

## 2022-03-02 MED ORDER — ALBUTEROL SULFATE HFA 108 (90 BASE) MCG/ACT IN AERS: 2.0000 | INHALATION_SPRAY | Freq: Four times a day (QID) | RESPIRATORY_TRACT | 1 refills | Status: DC | PRN

## 2022-03-02 MED ORDER — FLUTICASONE PROPIONATE 50 MCG/ACT NA SUSP: 2.0000 | Freq: Every day | NASAL | 5 refills | Status: DC

## 2022-03-02 NOTE — Addendum Note (Signed)
Addended by: Larence Penning on: 03/02/2022 12:27 PM   Modules accepted: Orders

## 2022-03-09 ENCOUNTER — Ambulatory Visit (INDEPENDENT_AMBULATORY_CARE_PROVIDER_SITE_OTHER): Payer: PRIVATE HEALTH INSURANCE | Admitting: Adult Health

## 2022-03-09 ENCOUNTER — Encounter: Payer: Self-pay | Admitting: Adult Health

## 2022-03-09 VITALS — BP 126/79 | HR 91 | Ht 67.0 in | Wt 204.5 lb

## 2022-03-09 DIAGNOSIS — N6452 Nipple discharge: Secondary | ICD-10-CM | POA: Diagnosis not present

## 2022-03-09 NOTE — Progress Notes (Signed)
  Subjective:     Patient ID: Christie Griffin, female   DOB: [birth year], 51 y.o.   MRN: [id number]  HPI Christie Griffin is a 52 year old white female, married, PM in for complaints of right nipple discharge for 3 days was white,thick and sticky, noticed when got out of shower Friday, none now.     Component Value Date/Time   DIAGPAP  11/04/2021 0845    - Negative for intraepithelial lesion or malignancy (NILM)   DIAGPAP  10/17/2018 0000    NEGATIVE FOR INTRAEPITHELIAL LESIONS OR MALIGNANCY. BENIGN REACTIVE/REPARATIVE CHANGES.   Fidelity Negative 11/04/2021 0845   ADEQPAP  11/04/2021 0845    Satisfactory for evaluation; transformation zone component PRESENT.   ADEQPAP  10/17/2018 0000    Satisfactory for evaluation  endocervical/transformation zone component PRESENT.    Review of Systems Had white thick sticky discharge right breast for about 3 days, none now   Reviewed past medical,surgical, social and family history. Reviewed medications and allergies.  Objective:   Physical Exam BP 126/79 (BP Location: Left Arm, Patient Position: Sitting, Cuff Size: Normal)   Pulse 91   Ht '5\' 7"'$  (1.702 m)   Wt 204 lb 8 oz (92.8 kg)   LMP 02/23/2022 (Approximate)   BMI 32.03 kg/m      Skin warm and dry,  Breasts:no dominate palpable mass, retraction or nipple discharge  Fall risk is low  Upstream - 03/09/22 1041       Pregnancy Intention Screening   Does the patient want to become pregnant in the next year? No    Does the patient's partner want to become pregnant in the next year? No    Would the patient like to discuss contraceptive options today? No      Contraception Wrap Up   Current Method Oral Contraceptive    End Method Oral Contraceptive    Contraception Counseling Provided No             Assessment:     1. Breast discharge Had white thick sticky discharge right breast for about 3 days, none now Checked TSH and Prolactin level before exam Leave breast alone and call me if  returns       Plan:     Follow up prn

## 2022-03-10 LAB — PROLACTIN: Prolactin: 12.7 ng/mL (ref 4.8–23.3)

## 2022-03-10 LAB — TSH: TSH: 1.91 u[IU]/mL (ref 0.450–4.500)

## 2022-03-11 ENCOUNTER — Ambulatory Visit (INDEPENDENT_AMBULATORY_CARE_PROVIDER_SITE_OTHER): Payer: PRIVATE HEALTH INSURANCE

## 2022-03-11 DIAGNOSIS — J309 Allergic rhinitis, unspecified: Secondary | ICD-10-CM

## 2022-03-11 MED ORDER — EPINEPHRINE 0.3 MG/0.3ML IJ SOAJ: 0.3000 mg | INTRAMUSCULAR | 1 refills | Status: DC | PRN

## 2022-03-11 NOTE — Progress Notes (Signed)
Immunotherapy   Patient Details  Name: Terriann Difonzo MRN: [id number] Date of Birth: 06-22-1970  03/11/2022  [name] started injections for  grass, weeds, trees, dogs, and dust mites.  Following schedule: B  Frequency:1 time per week Epi-Pen:Epi-Pen Available  Consent signed previously and patient instructions given as well as emergency action plan.Patient waited in the lobby for thirty minutes without an issue.    Julius Bowels 03/11/2022, 3:16 PM

## 2022-03-20 ENCOUNTER — Ambulatory Visit (INDEPENDENT_AMBULATORY_CARE_PROVIDER_SITE_OTHER): Payer: PRIVATE HEALTH INSURANCE

## 2022-03-20 DIAGNOSIS — J309 Allergic rhinitis, unspecified: Secondary | ICD-10-CM

## 2022-03-25 ENCOUNTER — Ambulatory Visit (INDEPENDENT_AMBULATORY_CARE_PROVIDER_SITE_OTHER): Payer: PRIVATE HEALTH INSURANCE

## 2022-03-25 DIAGNOSIS — J309 Allergic rhinitis, unspecified: Secondary | ICD-10-CM

## 2022-04-01 ENCOUNTER — Ambulatory Visit: Payer: Self-pay

## 2022-04-01 ENCOUNTER — Encounter: Payer: Self-pay | Admitting: Allergy & Immunology

## 2022-04-01 ENCOUNTER — Ambulatory Visit (INDEPENDENT_AMBULATORY_CARE_PROVIDER_SITE_OTHER): Payer: PRIVATE HEALTH INSURANCE | Admitting: Allergy & Immunology

## 2022-04-01 ENCOUNTER — Ambulatory Visit: Payer: PRIVATE HEALTH INSURANCE | Admitting: Allergy

## 2022-04-01 VITALS — BP 122/76 | HR 77 | Temp 98.3°F | Resp 18

## 2022-04-01 DIAGNOSIS — J302 Other seasonal allergic rhinitis: Secondary | ICD-10-CM

## 2022-04-01 DIAGNOSIS — J4550 Severe persistent asthma, uncomplicated: Secondary | ICD-10-CM

## 2022-04-01 DIAGNOSIS — J309 Allergic rhinitis, unspecified: Secondary | ICD-10-CM

## 2022-04-01 MED ORDER — CETIRIZINE HCL 10 MG PO TABS: 10.0000 mg | ORAL_TABLET | Freq: Every day | ORAL | 1 refills | Status: DC

## 2022-04-01 MED ORDER — MONTELUKAST SODIUM 10 MG PO TABS: 10.0000 mg | ORAL_TABLET | Freq: Every day | ORAL | 1 refills | Status: DC

## 2022-04-01 MED ORDER — BUDESONIDE-FORMOTEROL FUMARATE 160-4.5 MCG/ACT IN AERO: INHALATION_SPRAY | RESPIRATORY_TRACT | 1 refills | Status: DC

## 2022-04-01 NOTE — Progress Notes (Signed)
FOLLOW UP  Date of Service/Encounter:  04/01/22   Assessment:   Moderate persistent asthma, uncomplicated - on Fasenra every 8 weeks (administers at home)   Perennial and seasonal allergic rhinitis (grasses, trees, dog, dust mites) - on allergen immunotherapy as an immunomodulatory treatment  Plan/Recommendations:   1. Moderate persistent asthma, uncomplicated - Lung testing looks amazing. - Daily controller medication(s): Symbicort 160/4.80mg two puffs twice daily with spacer + Fasenra every 8 weeks  - Prior to physical activity: albuterol 2 puffs 10-15 minutes before physical activity. - Rescue medications: albuterol 4 puffs every 4-6 hours as needed - Asthma control goals:  * Full participation in all desired activities (may need albuterol before activity) * Albuterol use two time or less a week on average (not counting use with activity) * Cough interfering with sleep two time or less a month * Oral steroids no more than once a year * No hospitalizations  2. Seasonal and perennial allergic rhinitis - Continue with allergy shots at the same schedule. - Continue with Flonase one spray per nostril up to twice daily.  - This will determine how many vials we will need for allergy shots. - We will call you in 1-2 weeks with the results of the testing.  3. Return in about 6 months (around 10/01/2022).    Subjective:   Christie Meyerhoffis a 51y.o. 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   Elevated hemoglobin A1c 10/31/2020   Irregular bleeding 10/25/2019   Encounter for screening fecal occult blood testing 10/25/2019   Encounter for well woman exam with routine  gynecological exam 10/25/2019   Screening for colorectal cancer 10/17/2018   Encounter for gynecological examination with Papanicolaou smear of cervix 10/17/2018   Fibroids, intramural 11/11/2017   Hemorrhoidal skin tags 10/07/2016   Encounter for menstrual regulation 10/07/2016   Fecal occult blood test positive 10/07/2016   Cough variant asthma 11/29/2015   Allergic rhinoconjunctivitis 11/29/2015   Vitamin D deficiency 09/30/2015   Hemorrhoid 09/20/2013   Heavy menses 09/20/2013   Wheezing 09/20/2013   Essential hypertension 08/18/2012    History obtained from: chart review and patient.  [name] a 51y.o. female presenting for a follow up visit.  She was last seen by me in October 2023.  At that time, we talked about having a biologic for better control of her asthma while we started allergy shots.  We continued Symbicort 160 mcg 2 puffs twice daily.  For her rhinitis, she underwent allergy testing with Dr. PPosey Pronto  We ended up starting her on allergen immunotherapy and continue with Flonase.  Since last visit, she has done very well.   Asthma/Respiratory Symptom History: She remains on the [place] is spacing that out to every 8 weeks next. She feels that this has helped a lot. She is doing Symbicort two puffs BID as well. This combination has gone very well. She has not been on prednisone at all since the last visit. She has never felt this good.  She has been doing very well with this and has not been on prednisone. She has not had any problems when going outdoors during this time of the year, which  is typically very difficult for her. She feels good about how she is doing at this point in time.   Allergic Rhinitis Symptom History: Allergic rhinitis is controlled with the use of her fluticasone. Allergy shots are going well without any signs of large local reactions.   Christie Griffin is on allergen immunotherapy. She receives one injection. Immunotherapy script #1 contains weeds, grasses,  dust mites, and dog. She currently receives 0.40m of the BLUE vial (1/100,000). She started shots October of 2023 and not yet reached maintenance.   Otherwise, there have been no changes to her past medical history, surgical history, family history, or social history.    Review of Systems  Constitutional: Negative.  Negative for chills, fever, malaise/fatigue and weight loss.  HENT:  Negative for congestion, ear discharge, ear pain and sinus pain.   Eyes:  Negative for pain, discharge and redness.  Respiratory:  Negative for cough, sputum production, shortness of breath and wheezing.   Cardiovascular: Negative.  Negative for chest pain and palpitations.  Gastrointestinal:  Negative for abdominal pain, heartburn, nausea and vomiting.  Skin: Negative.  Negative for itching and rash.  Neurological:  Negative for dizziness and headaches.  Endo/Heme/Allergies:  Negative for environmental allergies. Does not bruise/bleed easily.       Objective:   Blood pressure 122/76, pulse 77, temperature 98.3 F (36.8 Christie), resp. rate 18, last menstrual period 02/23/2022, SpO2 96 %. There is no height or weight on file to calculate BMI.    Physical Exam Vitals reviewed.  Constitutional:      Appearance: She is well-developed.  HENT:     Head: Normocephalic and atraumatic.     Right Ear: Tympanic membrane, ear canal and external ear normal.     Left Ear: Tympanic membrane, ear canal and external ear normal.     Nose: No nasal deformity, septal deviation, mucosal edema or rhinorrhea.     Right Turbinates: Enlarged, swollen and pale.     Left Turbinates: Enlarged, swollen and pale.     Right Sinus: No maxillary sinus tenderness or frontal sinus tenderness.     Left Sinus: No maxillary sinus tenderness or frontal sinus tenderness.     Comments: No nasal polyps noted.     Mouth/Throat:     Mouth: Mucous membranes are not pale and not dry.     Pharynx: Uvula midline.  Eyes:     General: Lids are  normal. Allergic shiner present.        Right eye: No discharge.        Left eye: No discharge.     Conjunctiva/sclera: Conjunctivae normal.     Right eye: Right conjunctiva is not injected. No chemosis.    Left eye: Left conjunctiva is not injected. No chemosis.    Pupils: Pupils are equal, round, and reactive to light.  Cardiovascular:     Rate and Rhythm: Normal rate and regular rhythm.     Heart sounds: Normal heart sounds.  Pulmonary:     Effort: Pulmonary effort is normal. No tachypnea, accessory muscle usage or respiratory distress.     Breath sounds: Normal breath sounds. No wheezing, rhonchi or rales.  Chest:     Chest wall: No tenderness.  Lymphadenopathy:     Cervical: No cervical adenopathy.  Skin:    General: Skin is warm.     Capillary Refill: Capillary refill takes less than 2 seconds.     Coloration: Skin is not pale.     Findings: No abrasion, erythema,  petechiae or rash. Rash is not papular, urticarial or vesicular.  Neurological:     Mental Status: She is alert.  Psychiatric:        Behavior: Behavior is cooperative.      Diagnostic studies:   Spirometry: results normal (FEV1: 2.04/92%, FVC: 2.54/93%, FEV1/FVC: 80%).    Spirometry consistent with normal pattern.   Allergy Studies: none        Salvatore Marvel, MD  [facility]

## 2022-04-01 NOTE — Addendum Note (Signed)
Addended by: Norville Haggard on: 04/01/2022 04:46 PM   Modules accepted: Orders

## 2022-04-01 NOTE — Patient Instructions (Addendum)
1. Moderate persistent asthma, uncomplicated - Lung testing looks amazing. - Daily controller medication(s): Symbicort 160/4.85mg two puffs twice daily with spacer + Fasenra every 8 weeks  - Prior to physical activity: albuterol 2 puffs 10-15 minutes before physical activity. - Rescue medications: albuterol 4 puffs every 4-6 hours as needed - Asthma control goals:  * Full participation in all desired activities (may need albuterol before activity) * Albuterol use two time or less a week on average (not counting use with activity) * Cough interfering with sleep two time or less a month * Oral steroids no more than once a year * No hospitalizations  2. Seasonal and perennial allergic rhinitis - Continue with allergy shots at the same schedule. - Continue with Flonase one spray per nostril up to twice daily.  - This will determine how many vials we will need for allergy shots. - We will call you in 1-2 weeks with the results of the testing.  3. Return in about 6 months (around 10/01/2022).    Please inform [place]of any Emergency Department visits, hospitalizations, or changes in symptoms. Call u[place]efore going to the ED for breathing or allergy symptoms since we might be able to fit you in for a sick visit. Feel free to contact u[place]anytime with any questions, problems, or concerns.  It was a pleasure to see you again today!  Websites that have reliable patient information: 1. American Academy of Asthma, Allergy, and Immunology: www.aaaai.org 2. Food Allergy Research and Education (FARE): foodallergy.org 3. Mothers of Asthmatics: http://www.asthmacommunitynetwork.org 4. American College of Allergy, Asthma, and Immunology: www.acaai.org   COVID-19 Vaccine Information can be found at: [email or web address] questions related to vaccine distribution or appointments, please email [email or web address] or call [phone]   We realize  that you might be concerned about having an allergic reaction to the COVID19 vaccines. To help with that concern, WE ARE OFFERING THE COVID19 VACCINES IN OUR OFFICE! Ask the front desk for dates!     "Like" u[place]on Facebook and Instagram for our latest updates!      A healthy democracy works best when ANew York Life Insuranceparticipate! Make sure you are registered to vote! If you have moved or changed any of your contact information, you will need to get this updated before voting!  In some cases, you MAY be able to register to vote online: [email or web address]

## 2022-04-10 ENCOUNTER — Ambulatory Visit (INDEPENDENT_AMBULATORY_CARE_PROVIDER_SITE_OTHER): Payer: PRIVATE HEALTH INSURANCE

## 2022-04-10 DIAGNOSIS — J309 Allergic rhinitis, unspecified: Secondary | ICD-10-CM

## 2022-04-15 ENCOUNTER — Ambulatory Visit (INDEPENDENT_AMBULATORY_CARE_PROVIDER_SITE_OTHER): Payer: PRIVATE HEALTH INSURANCE

## 2022-04-15 DIAGNOSIS — J309 Allergic rhinitis, unspecified: Secondary | ICD-10-CM | POA: Diagnosis not present

## 2022-04-22 ENCOUNTER — Ambulatory Visit (INDEPENDENT_AMBULATORY_CARE_PROVIDER_SITE_OTHER): Payer: PRIVATE HEALTH INSURANCE

## 2022-04-22 DIAGNOSIS — J309 Allergic rhinitis, unspecified: Secondary | ICD-10-CM

## 2022-05-01 ENCOUNTER — Ambulatory Visit (INDEPENDENT_AMBULATORY_CARE_PROVIDER_SITE_OTHER): Payer: PRIVATE HEALTH INSURANCE

## 2022-05-01 DIAGNOSIS — J309 Allergic rhinitis, unspecified: Secondary | ICD-10-CM

## 2022-05-06 ENCOUNTER — Ambulatory Visit (INDEPENDENT_AMBULATORY_CARE_PROVIDER_SITE_OTHER): Payer: PRIVATE HEALTH INSURANCE

## 2022-05-06 DIAGNOSIS — J309 Allergic rhinitis, unspecified: Secondary | ICD-10-CM

## 2022-05-13 ENCOUNTER — Ambulatory Visit (INDEPENDENT_AMBULATORY_CARE_PROVIDER_SITE_OTHER): Payer: PRIVATE HEALTH INSURANCE

## 2022-05-13 DIAGNOSIS — J309 Allergic rhinitis, unspecified: Secondary | ICD-10-CM

## 2022-05-22 ENCOUNTER — Ambulatory Visit (INDEPENDENT_AMBULATORY_CARE_PROVIDER_SITE_OTHER): Payer: PRIVATE HEALTH INSURANCE

## 2022-05-22 DIAGNOSIS — J309 Allergic rhinitis, unspecified: Secondary | ICD-10-CM | POA: Diagnosis not present

## 2022-05-27 ENCOUNTER — Ambulatory Visit (INDEPENDENT_AMBULATORY_CARE_PROVIDER_SITE_OTHER): Payer: PRIVATE HEALTH INSURANCE

## 2022-05-27 DIAGNOSIS — J309 Allergic rhinitis, unspecified: Secondary | ICD-10-CM | POA: Diagnosis not present

## 2022-06-03 ENCOUNTER — Ambulatory Visit (INDEPENDENT_AMBULATORY_CARE_PROVIDER_SITE_OTHER): Payer: PRIVATE HEALTH INSURANCE

## 2022-06-03 DIAGNOSIS — J309 Allergic rhinitis, unspecified: Secondary | ICD-10-CM

## 2022-06-10 ENCOUNTER — Ambulatory Visit (INDEPENDENT_AMBULATORY_CARE_PROVIDER_SITE_OTHER): Payer: PRIVATE HEALTH INSURANCE

## 2022-06-10 DIAGNOSIS — J309 Allergic rhinitis, unspecified: Secondary | ICD-10-CM

## 2022-06-17 ENCOUNTER — Ambulatory Visit (INDEPENDENT_AMBULATORY_CARE_PROVIDER_SITE_OTHER): Payer: PRIVATE HEALTH INSURANCE

## 2022-06-17 DIAGNOSIS — J309 Allergic rhinitis, unspecified: Secondary | ICD-10-CM

## 2022-06-22 ENCOUNTER — Other Ambulatory Visit (HOSPITAL_COMMUNITY): Payer: Self-pay | Admitting: Adult Health

## 2022-06-22 DIAGNOSIS — Z1231 Encounter for screening mammogram for malignant neoplasm of breast: Secondary | ICD-10-CM

## 2022-06-24 ENCOUNTER — Ambulatory Visit (INDEPENDENT_AMBULATORY_CARE_PROVIDER_SITE_OTHER): Payer: PRIVATE HEALTH INSURANCE

## 2022-06-24 DIAGNOSIS — J309 Allergic rhinitis, unspecified: Secondary | ICD-10-CM

## 2022-07-03 ENCOUNTER — Ambulatory Visit (INDEPENDENT_AMBULATORY_CARE_PROVIDER_SITE_OTHER): Payer: PRIVATE HEALTH INSURANCE | Admitting: *Deleted

## 2022-07-03 DIAGNOSIS — J309 Allergic rhinitis, unspecified: Secondary | ICD-10-CM

## 2022-07-08 ENCOUNTER — Ambulatory Visit (INDEPENDENT_AMBULATORY_CARE_PROVIDER_SITE_OTHER): Payer: PRIVATE HEALTH INSURANCE

## 2022-07-08 DIAGNOSIS — J309 Allergic rhinitis, unspecified: Secondary | ICD-10-CM

## 2022-07-15 ENCOUNTER — Ambulatory Visit (INDEPENDENT_AMBULATORY_CARE_PROVIDER_SITE_OTHER): Payer: PRIVATE HEALTH INSURANCE

## 2022-07-15 DIAGNOSIS — J309 Allergic rhinitis, unspecified: Secondary | ICD-10-CM | POA: Diagnosis not present

## 2022-07-22 ENCOUNTER — Ambulatory Visit (INDEPENDENT_AMBULATORY_CARE_PROVIDER_SITE_OTHER): Payer: PRIVATE HEALTH INSURANCE

## 2022-07-22 DIAGNOSIS — J309 Allergic rhinitis, unspecified: Secondary | ICD-10-CM

## 2022-07-29 ENCOUNTER — Ambulatory Visit (INDEPENDENT_AMBULATORY_CARE_PROVIDER_SITE_OTHER): Payer: PRIVATE HEALTH INSURANCE

## 2022-07-29 DIAGNOSIS — J309 Allergic rhinitis, unspecified: Secondary | ICD-10-CM

## 2022-08-05 ENCOUNTER — Ambulatory Visit (INDEPENDENT_AMBULATORY_CARE_PROVIDER_SITE_OTHER): Payer: PRIVATE HEALTH INSURANCE

## 2022-08-05 DIAGNOSIS — J309 Allergic rhinitis, unspecified: Secondary | ICD-10-CM | POA: Diagnosis not present

## 2022-08-14 ENCOUNTER — Ambulatory Visit (INDEPENDENT_AMBULATORY_CARE_PROVIDER_SITE_OTHER): Payer: PRIVATE HEALTH INSURANCE

## 2022-08-14 DIAGNOSIS — J309 Allergic rhinitis, unspecified: Secondary | ICD-10-CM | POA: Diagnosis not present

## 2022-08-19 ENCOUNTER — Ambulatory Visit (INDEPENDENT_AMBULATORY_CARE_PROVIDER_SITE_OTHER): Payer: PRIVATE HEALTH INSURANCE

## 2022-08-19 DIAGNOSIS — J309 Allergic rhinitis, unspecified: Secondary | ICD-10-CM | POA: Diagnosis not present

## 2022-08-21 ENCOUNTER — Telehealth: Payer: Self-pay

## 2022-08-21 MED ORDER — MONTELUKAST SODIUM 10 MG PO TABS: 10.0000 mg | ORAL_TABLET | Freq: Every day | ORAL | 0 refills | Status: DC

## 2022-08-21 NOTE — Telephone Encounter (Signed)
Left a detailed message informing patient that refills have been sent to the requested pharmacy. I also informed patient via message to keep her appointment with Dr. Dellis Anes on 10/16/2022 at 10 am for further refills.

## 2022-08-21 NOTE — Telephone Encounter (Signed)
Patient called to request for a new prescription on Singulair.   [employer] - Marcy Panning, [facility]

## 2022-08-21 NOTE — Addendum Note (Signed)
Addended by: Dub Mikes on: 08/21/2022 01:36 PM   Modules accepted: Orders

## 2022-08-26 ENCOUNTER — Ambulatory Visit (INDEPENDENT_AMBULATORY_CARE_PROVIDER_SITE_OTHER): Payer: PRIVATE HEALTH INSURANCE

## 2022-08-26 DIAGNOSIS — J309 Allergic rhinitis, unspecified: Secondary | ICD-10-CM | POA: Diagnosis not present

## 2022-09-01 NOTE — Progress Notes (Signed)
VIALS EXP 09-01-23 

## 2022-09-02 ENCOUNTER — Ambulatory Visit (INDEPENDENT_AMBULATORY_CARE_PROVIDER_SITE_OTHER): Payer: PRIVATE HEALTH INSURANCE

## 2022-09-02 DIAGNOSIS — J309 Allergic rhinitis, unspecified: Secondary | ICD-10-CM

## 2022-09-02 DIAGNOSIS — J301 Allergic rhinitis due to pollen: Secondary | ICD-10-CM | POA: Diagnosis not present

## 2022-09-11 ENCOUNTER — Ambulatory Visit (INDEPENDENT_AMBULATORY_CARE_PROVIDER_SITE_OTHER): Payer: PRIVATE HEALTH INSURANCE

## 2022-09-11 DIAGNOSIS — J309 Allergic rhinitis, unspecified: Secondary | ICD-10-CM

## 2022-09-15 ENCOUNTER — Other Ambulatory Visit: Payer: Self-pay | Admitting: Adult Health

## 2022-09-15 MED ORDER — FLUCONAZOLE 150 MG PO TABS: ORAL_TABLET | ORAL | 3 refills | Status: DC

## 2022-09-15 NOTE — Progress Notes (Signed)
Refilled diflucan 

## 2022-09-16 ENCOUNTER — Ambulatory Visit (INDEPENDENT_AMBULATORY_CARE_PROVIDER_SITE_OTHER): Payer: PRIVATE HEALTH INSURANCE

## 2022-09-16 DIAGNOSIS — J309 Allergic rhinitis, unspecified: Secondary | ICD-10-CM

## 2022-09-21 ENCOUNTER — Other Ambulatory Visit: Payer: Self-pay | Admitting: Adult Health

## 2022-09-21 MED ORDER — AMLODIPINE BESYLATE 2.5 MG PO TABS: 2.5000 mg | ORAL_TABLET | Freq: Every day | ORAL | 3 refills | Status: DC

## 2022-09-21 MED ORDER — HYDROCHLOROTHIAZIDE 12.5 MG PO CAPS: 12.5000 mg | ORAL_CAPSULE | Freq: Every day | ORAL | 3 refills | Status: DC

## 2022-09-21 NOTE — Progress Notes (Signed)
Refilled Norvasc and Microzide

## 2022-09-25 ENCOUNTER — Ambulatory Visit (INDEPENDENT_AMBULATORY_CARE_PROVIDER_SITE_OTHER): Payer: PRIVATE HEALTH INSURANCE

## 2022-09-25 DIAGNOSIS — J309 Allergic rhinitis, unspecified: Secondary | ICD-10-CM | POA: Diagnosis not present

## 2022-09-30 ENCOUNTER — Ambulatory Visit (INDEPENDENT_AMBULATORY_CARE_PROVIDER_SITE_OTHER): Payer: PRIVATE HEALTH INSURANCE

## 2022-09-30 DIAGNOSIS — J309 Allergic rhinitis, unspecified: Secondary | ICD-10-CM

## 2022-10-14 ENCOUNTER — Ambulatory Visit (INDEPENDENT_AMBULATORY_CARE_PROVIDER_SITE_OTHER): Payer: PRIVATE HEALTH INSURANCE

## 2022-10-14 DIAGNOSIS — J309 Allergic rhinitis, unspecified: Secondary | ICD-10-CM | POA: Diagnosis not present

## 2022-10-16 ENCOUNTER — Ambulatory Visit: Payer: PRIVATE HEALTH INSURANCE | Admitting: Allergy & Immunology

## 2022-10-16 ENCOUNTER — Other Ambulatory Visit: Payer: Self-pay

## 2022-10-16 ENCOUNTER — Encounter: Payer: Self-pay | Admitting: Allergy & Immunology

## 2022-10-16 VITALS — BP 104/72 | HR 96 | Temp 98.3°F | Resp 18 | Ht 67.0 in | Wt 206.2 lb

## 2022-10-16 DIAGNOSIS — J302 Other seasonal allergic rhinitis: Secondary | ICD-10-CM

## 2022-10-16 DIAGNOSIS — J3089 Other allergic rhinitis: Secondary | ICD-10-CM | POA: Diagnosis not present

## 2022-10-16 DIAGNOSIS — J4550 Severe persistent asthma, uncomplicated: Secondary | ICD-10-CM | POA: Diagnosis not present

## 2022-10-16 MED ORDER — AIRSUPRA 90-80 MCG/ACT IN AERO
2.0000 | INHALATION_SPRAY | RESPIRATORY_TRACT | 5 refills | Status: AC | PRN
Start: 2022-10-16 — End: ?

## 2022-10-16 MED ORDER — MONTELUKAST SODIUM 10 MG PO TABS: 10.0000 mg | ORAL_TABLET | Freq: Every day | ORAL | 1 refills | Status: DC

## 2022-10-16 MED ORDER — FLUTICASONE PROPIONATE 50 MCG/ACT NA SUSP: 2.0000 | Freq: Every day | NASAL | 1 refills | Status: DC

## 2022-10-16 MED ORDER — BUDESONIDE-FORMOTEROL FUMARATE 160-4.5 MCG/ACT IN AERO
INHALATION_SPRAY | RESPIRATORY_TRACT | 1 refills | Status: DC
Start: 2022-10-16 — End: 2023-04-28

## 2022-10-16 NOTE — Progress Notes (Signed)
FOLLOW UP  Date of Service/Encounter:  10/16/22   Assessment:   Moderate persistent asthma, uncomplicated - on Fasenra every 8 weeks (administers at home) - consider stopping Fasenra at the next visit    Perennial and seasonal allergic rhinitis (grasses, trees, dog, dust mites) - on allergen immunotherapy with maintenance reached March 2024 with marked improvement of symptoms   Plan/Recommendations:   1. Moderate persistent asthma, uncomplicated - Lung testing looks amazing. - We are not going to make any changes. - Let us know if the Symbicort is too much.  - We can change this.  - We are going to start AirSupra instead of albuterol (contains albuterol plus an inhaled steroids).  - Copay card and sample provided.  - Daily controller medication(s): Symbicort 160/4.55mcg two puffs twice daily with spacer + Fasenra every 8 weeks  - Prior to physical activity: AirSupra 2 puffs 10-15 minutes before physical activity. - Rescue medications: AirSupra 4 puffs every 4-6 hours as needed - Asthma control goals:  * Full participation in all desired activities (may need albuterol before activity) * Albuterol use two time or less a week on average (not counting use with activity) * Cough interfering with sleep two time or less a month * Oral steroids no more than once a year * No hospitalizations  2. Seasonal and perennial allergic rhinitis - Continue with allergy shots at the same schedule. - Continue with Flonase one spray per nostril up to twice daily.   3. Return in about 6 months (around 04/17/2023).   Subjective:   Christie Griffin is a 52 y.o. female presenting today for follow up of  Chief Complaint  Patient presents with   Asthma    Says she is well. No issues or concerns at this time.    Allergic Rhinitis     Says she is well. No issues or concerns at this time.    Immunotherapy    Says he injections are going well.     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   Elevated hemoglobin A1c 10/31/2020   Irregular bleeding 10/25/2019   Encounter for screening fecal occult blood testing 10/25/2019   Encounter for well woman exam with routine gynecological exam 10/25/2019   Screening for colorectal cancer 10/17/2018   Encounter for gynecological examination with Papanicolaou smear of cervix 10/17/2018   Fibroids, intramural 11/11/2017   Hemorrhoidal skin tags 10/07/2016   Encounter for menstrual regulation 10/07/2016   Fecal occult blood test positive 10/07/2016   Cough variant asthma 11/29/2015   Allergic rhinoconjunctivitis 11/29/2015   Vitamin D deficiency 09/30/2015   Hemorrhoid 09/20/2013   Heavy menses 09/20/2013   Wheezing 09/20/2013   Essential hypertension 08/18/2012    History obtained from: chart review and patient.  Christie Griffin is a 52 y.o. female presenting for a follow up visit. She was last seen in December 2023. At that time, we continued with Symbicort daily as well as [place] Challenger every 8 weeks. For her rhinitis, we continue with allergy shots at the same schedule.  We also continue with Flonase.  Since last visit, she has done well.   Asthma/Respiratory Symptom History: The heat has bene making it hard to breathe. She remains on the [place] and this seems to be working well. She is getting this every 8 weeks.  She remains on the Symbicort two puffs  at least once daily in the mornings with increases to twice daily during bad times of the year.   Allergic Rhinitis Symptom History: She does feel that the shots are working. She has not been sick since the last visit.   Christie Griffin is on allergen immunotherapy. She receives one injection. Immunotherapy script #1 contains weeds, grasses, dust mites, and dog. She currently receives 0.36mL of the RED vial (1/100). She started shots October of 2023 and  reached maintenance in March 2024.   Otherwise, there have been no changes to her past medical history, surgical history, family history, or social history.    Review of Systems  Constitutional: Negative.  Negative for chills, fever, malaise/fatigue and weight loss.  HENT:  Negative for congestion, ear discharge, ear pain and sinus pain.   Eyes:  Negative for pain, discharge and redness.  Respiratory:  Negative for cough, sputum production, shortness of breath and wheezing.   Cardiovascular: Negative.  Negative for chest pain and palpitations.  Gastrointestinal:  Negative for abdominal pain, heartburn, nausea and vomiting.  Skin: Negative.  Negative for itching and rash.  Neurological:  Negative for dizziness and headaches.  Endo/Heme/Allergies:  Negative for environmental allergies. Does not bruise/bleed easily.       Objective:   Blood pressure 104/72, pulse 96, temperature 98.3 F (36.8 C), temperature source Temporal, resp. rate 18, height 5\' 7"  (1.702 m), weight 206 lb 3.2 oz (93.5 kg), SpO2 95 %. Body mass index is 32.3 kg/m.    Physical Exam Vitals reviewed.  Constitutional:      Appearance: She is well-developed.     Comments: Pleasant. Smiling.   HENT:     Head: Normocephalic and atraumatic.     Right Ear: Tympanic membrane, ear canal and external ear normal.     Left Ear: Tympanic membrane, ear canal and external ear normal.     Nose: No nasal deformity, septal deviation, mucosal edema or rhinorrhea.     Right Turbinates: Enlarged, swollen and pale.     Left Turbinates: Enlarged, swollen and pale.     Right Sinus: No maxillary sinus tenderness or frontal sinus tenderness.     Left Sinus: No maxillary sinus tenderness or frontal sinus tenderness.     Comments: No nasal polyps noted.     Mouth/Throat:     Mouth: Mucous membranes are not pale and not dry.     Pharynx: Uvula midline.  Eyes:     General: Lids are normal. Allergic shiner present.        Right eye:  No discharge.        Left eye: No discharge.     Conjunctiva/sclera: Conjunctivae normal.     Right eye: Right conjunctiva is not injected. No chemosis.    Left eye: Left conjunctiva is not injected. No chemosis.    Pupils: Pupils are equal, round, and reactive to light.  Cardiovascular:     Rate and Rhythm: Normal rate and regular rhythm.     Heart sounds: Normal heart sounds.  Pulmonary:     Effort: Pulmonary effort is normal. No tachypnea, accessory muscle usage or respiratory distress.     Breath sounds: Normal breath sounds. No wheezing, rhonchi or rales.     Comments: Moving air well in all lung fields.  Chest:     Chest wall: No tenderness.  Lymphadenopathy:     Cervical: No cervical adenopathy.  Skin:    General: Skin is warm.     Capillary Refill: Capillary refill takes  less than 2 seconds.     Coloration: Skin is not pale.     Findings: No abrasion, erythema, petechiae or rash. Rash is not papular, urticarial or vesicular.  Neurological:     Mental Status: She is alert.  Psychiatric:        Behavior: Behavior is cooperative.      Diagnostic studies:    Spirometry: results normal (FEV1: 2.21/74%, FVC: 2.84/76%, FEV1/FVC: 78%).    Spirometry consistent with possible restrictive disease.  However, this is much better than it was at the last visit.    Allergy Studies: none       Malachi Bonds, MD  [facility]

## 2022-10-16 NOTE — Patient Instructions (Addendum)
1. Moderate persistent asthma, uncomplicated - Lung testing looks amazing. - We are not going to make any changes. - Let us know if the Symbicort is too much.  - We can change this.  - We are going to start AirSupra instead of albuterol (contains albuterol plus an inhaled steroids).  - Copay card and sample provided.  - Daily controller medication(s): Symbicort 160/4.47mcg two puffs twice daily with spacer + Fasenra every 8 weeks  - Prior to physical activity: AirSupra 2 puffs 10-15 minutes before physical activity. - Rescue medications: AirSupra 4 puffs every 4-6 hours as needed - Asthma control goals:  * Full participation in all desired activities (may need albuterol before activity) * Albuterol use two time or less a week on average (not counting use with activity) * Cough interfering with sleep two time or less a month * Oral steroids no more than once a year * No hospitalizations  2. Seasonal and perennial allergic rhinitis - Continue with allergy shots at the same schedule. - Continue with Flonase one spray per nostril up to twice daily.   3. Return in about 6 months (around 04/17/2023).    Please inform us of any Emergency Department visits, hospitalizations, or changes in symptoms. Call us before going to the ED for breathing or allergy symptoms since we might be able to fit you in for a sick visit. Feel free to contact us anytime with any questions, problems, or concerns.  It was a pleasure to see you again today!  Websites that have reliable patient information: 1. American Academy of Asthma, Allergy, and Immunology: www.aaaai.org 2. Food Allergy Research and Education (FARE): foodallergy.org 3. Mothers of Asthmatics: http://www.asthmacommunitynetwork.org 4. American College of Allergy, Asthma, and Immunology: www.acaai.org   COVID-19 Vaccine Information can be found at: [email or web address] For questions related to  vaccine distribution or appointments, please email [email or web address] or call [phone].   We realize that you might be concerned about having an allergic reaction to the COVID19 vaccines. To help with that concern, WE ARE OFFERING THE COVID19 VACCINES IN OUR OFFICE! Ask the front desk for dates!     "Like" [place] on Facebook and Instagram for our latest updates!      A healthy democracy works best when [employer] participate! Make sure you are registered to vote! If you have moved or changed any of your contact information, you will need to get this updated before voting!  In some cases, you MAY be able to register to vote online: [email or web address]

## 2022-10-19 NOTE — Addendum Note (Signed)
Addended by: Elsworth Soho on: 10/19/2022 05:39 PM   Modules accepted: Orders

## 2022-10-21 ENCOUNTER — Ambulatory Visit (INDEPENDENT_AMBULATORY_CARE_PROVIDER_SITE_OTHER): Payer: PRIVATE HEALTH INSURANCE

## 2022-10-21 DIAGNOSIS — J309 Allergic rhinitis, unspecified: Secondary | ICD-10-CM

## 2022-10-28 ENCOUNTER — Ambulatory Visit (INDEPENDENT_AMBULATORY_CARE_PROVIDER_SITE_OTHER): Payer: PRIVATE HEALTH INSURANCE

## 2022-10-28 DIAGNOSIS — J309 Allergic rhinitis, unspecified: Secondary | ICD-10-CM

## 2022-11-05 ENCOUNTER — Other Ambulatory Visit: Payer: Self-pay | Admitting: Allergy & Immunology

## 2022-11-06 ENCOUNTER — Ambulatory Visit (INDEPENDENT_AMBULATORY_CARE_PROVIDER_SITE_OTHER): Payer: PRIVATE HEALTH INSURANCE

## 2022-11-06 DIAGNOSIS — J309 Allergic rhinitis, unspecified: Secondary | ICD-10-CM

## 2022-11-12 ENCOUNTER — Ambulatory Visit: Payer: PRIVATE HEALTH INSURANCE | Admitting: Adult Health

## 2022-11-12 ENCOUNTER — Encounter: Payer: Self-pay | Admitting: Adult Health

## 2022-11-12 ENCOUNTER — Ambulatory Visit (HOSPITAL_COMMUNITY)
Admission: RE | Admit: 2022-11-12 | Discharge: 2022-11-12 | Disposition: A | Discharge status: Home / Self Care | Payer: PRIVATE HEALTH INSURANCE | Source: Ambulatory Visit | Attending: Adult Health | Admitting: Adult Health

## 2022-11-12 VITALS — BP 127/85 | HR 82 | Ht 67.0 in | Wt 207.5 lb

## 2022-11-12 DIAGNOSIS — Z01419 Encounter for gynecological examination (general) (routine) without abnormal findings: Secondary | ICD-10-CM

## 2022-11-12 DIAGNOSIS — N951 Menopausal and female climacteric states: Secondary | ICD-10-CM

## 2022-11-12 DIAGNOSIS — Z7689 Persons encountering health services in other specified circumstances: Secondary | ICD-10-CM | POA: Diagnosis not present

## 2022-11-12 DIAGNOSIS — I10 Essential (primary) hypertension: Secondary | ICD-10-CM

## 2022-11-12 DIAGNOSIS — Z1211 Encounter for screening for malignant neoplasm of colon: Secondary | ICD-10-CM

## 2022-11-12 DIAGNOSIS — Z1231 Encounter for screening mammogram for malignant neoplasm of breast: Secondary | ICD-10-CM | POA: Diagnosis present

## 2022-11-12 DIAGNOSIS — N926 Irregular menstruation, unspecified: Secondary | ICD-10-CM

## 2022-11-12 LAB — HEMOCCULT GUIAC POC 1CARD (OFFICE): Fecal Occult Blood, POC: NEGATIVE

## 2022-11-12 MED ORDER — NORETHINDRONE 0.35 MG PO TABS: 1.0000 | ORAL_TABLET | Freq: Every day | ORAL | 4 refills | Status: DC

## 2022-11-12 NOTE — Progress Notes (Signed)
Patient ID: Christie Griffin, female   DOB: Aug 03, 1970, 52 y.o.   MRN: [id number] History of Present Illness: Christie Griffin is a 52 year old white female, married, G1P1, in for a well woman gyn exam. She has not had a period since February and having some hot flashes and waking up at night. 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      Component Value Date/Time   DIAGPAP  11/04/2021 0845    - Negative for intraepithelial lesion or malignancy (NILM)   DIAGPAP  10/17/2018 0000    NEGATIVE FOR INTRAEPITHELIAL LESIONS OR MALIGNANCY. BENIGN REACTIVE/REPARATIVE CHANGES.   HPVHIGH Negative 11/04/2021 0845   ADEQPAP  11/04/2021 0845    Satisfactory for evaluation; transformation zone component PRESENT.   ADEQPAP  10/17/2018 0000    Satisfactory for evaluation  endocervical/transformation zone component PRESENT.    PCP is Grace Bushy FNP   Current Medications, Allergies, Past Medical History, Past Surgical History, Family History and Social History were reviewed in [employer] electronic medical record.     Review of Systems: Patient denies any headaches, hearing loss, fatigue, blurred vision, shortness of breath, chest pain, abdominal pain, problems with bowel movements, urination, or intercourse. No joint pain or mood swings.  She HPI for positives  Physical Exam:BP 127/85 (BP Location: Left Arm, Patient Position: Sitting, Cuff Size: Normal)   Pulse 82   Ht 5\' 7"  (1.702 m)   Wt 207 lb 8 oz (94.1 kg)   BMI 32.50 kg/m   General:  Well developed, well nourished, no acute distress Skin:  Warm and dry Neck:  Midline trachea, normal thyroid, good ROM, no lymphadenopathy Lungs; Clear to auscultation bilaterally Breast:  No dominant palpable mass, retraction, or nipple discharge Cardiovascular: Regular rate and rhythm Abdomen:  Soft, non tender, no hepatosplenomegaly Pelvic:  External genitalia is normal in appearance, no lesions.  The vagina is normal in appearance. Urethra has no  lesions or masses. The cervix is smooth.  Uterus is felt to be normal size, shape, and contour.  No adnexal masses or tenderness noted.Bladder is non tender, no masses felt. Rectal: Good sphincter tone, no polyps, or hemorrhoids felt.  Hemoccult negative. Extremities/musculoskeletal:  No swelling or varicosities noted, no clubbing or cyanosis Psych:  No mood changes, alert and cooperative,seems happy AA is 1 Fall risk is low    11/12/2022   11:02 AM 11/04/2021    8:38 AM 10/31/2020    8:41 AM  Depression screen PHQ 2/9  Decreased Interest 0 0 0  Down, Depressed, Hopeless 0 0 0  PHQ - 2 Score 0 0 0  Altered sleeping 1 0 0  Tired, decreased energy 0 0 0  Change in appetite 0 0 0  Feeling bad or failure about yourself  0 0 0  Trouble concentrating 0 0 0  Moving slowly or fidgety/restless 0 0 0  Suicidal thoughts 0 0 0  PHQ-9 Score 1 0 0       11/12/2022   11:02 AM 11/04/2021    8:46 AM 10/31/2020    8:41 AM 10/25/2019    8:41 AM  GAD 7 : Generalized Anxiety Score  Nervous, Anxious, on Edge 0 0 0 0  Control/stop worrying 0 0 0 0  Worry too much - different things 0 0 0 0  Trouble relaxing 0 0 0 0  Restless 0 0 0 0  Easily annoyed or irritable 0 3 0 0  Afraid - awful might happen 0 0 0 0  Total GAD 7 Score 0 3 0 0  Anxiety Difficulty    Not difficult at all      Upstream - 11/12/22 1110       Pregnancy Intention Screening   Does the patient want to become pregnant in the next year? No    Does the patient's partner want to become pregnant in the next year? No    Would the patient like to discuss contraceptive options today? No      Contraception Wrap Up   Current Method Oral Contraceptive    End Method Oral Contraceptive    Contraception Counseling Provided No            Examination chaperoned by Malachy Mood LPN  Impression and Plan: 1. Encounter for well woman exam with routine gynecological exam Physical in 1 year Pap in 2026 Mammogram was this morning at  [facility] with PCP Cologuard 2025  2. Encounter for screening fecal occult blood testing Hemoccult was negative - POCT occult blood stool  3. Peri-menopause Review handout on perimenopause Discussed symptoms with her Discussed HRT as option later on  4. Encounter for menstrual regulation Will continue Micronor Meds ordered this encounter  Medications   norethindrone (MICRONOR) 0.35 MG tablet    Sig: Take 1 tablet (0.35 mg total) by mouth daily.    Dispense:  84 tablet    Refill:  4    Order Specific Question:   Supervising Provider    Answer:   Despina Hidden, LUTHER H [[id number]]     5. Irregular periods Stay on Micronor for now  6. Essential hypertension BP good, on Norvasc 2.5 mg 1 daily and Microzide 12.5 mg 1 daily, has refills

## 2022-11-13 ENCOUNTER — Ambulatory Visit: Payer: Self-pay

## 2022-11-13 DIAGNOSIS — J309 Allergic rhinitis, unspecified: Secondary | ICD-10-CM | POA: Diagnosis not present

## 2022-11-18 ENCOUNTER — Ambulatory Visit (INDEPENDENT_AMBULATORY_CARE_PROVIDER_SITE_OTHER): Payer: PRIVATE HEALTH INSURANCE

## 2022-11-18 DIAGNOSIS — J309 Allergic rhinitis, unspecified: Secondary | ICD-10-CM | POA: Diagnosis not present

## 2022-11-25 ENCOUNTER — Ambulatory Visit (INDEPENDENT_AMBULATORY_CARE_PROVIDER_SITE_OTHER): Payer: PRIVATE HEALTH INSURANCE

## 2022-11-25 DIAGNOSIS — J309 Allergic rhinitis, unspecified: Secondary | ICD-10-CM | POA: Diagnosis not present

## 2022-12-04 ENCOUNTER — Ambulatory Visit (INDEPENDENT_AMBULATORY_CARE_PROVIDER_SITE_OTHER): Payer: PRIVATE HEALTH INSURANCE

## 2022-12-04 DIAGNOSIS — J309 Allergic rhinitis, unspecified: Secondary | ICD-10-CM

## 2022-12-09 ENCOUNTER — Ambulatory Visit: Payer: Self-pay

## 2022-12-09 DIAGNOSIS — J309 Allergic rhinitis, unspecified: Secondary | ICD-10-CM

## 2022-12-18 ENCOUNTER — Ambulatory Visit (INDEPENDENT_AMBULATORY_CARE_PROVIDER_SITE_OTHER): Payer: PRIVATE HEALTH INSURANCE

## 2022-12-18 DIAGNOSIS — J309 Allergic rhinitis, unspecified: Secondary | ICD-10-CM

## 2022-12-25 ENCOUNTER — Ambulatory Visit (INDEPENDENT_AMBULATORY_CARE_PROVIDER_SITE_OTHER): Payer: PRIVATE HEALTH INSURANCE

## 2022-12-25 DIAGNOSIS — J309 Allergic rhinitis, unspecified: Secondary | ICD-10-CM | POA: Diagnosis not present

## 2022-12-30 ENCOUNTER — Ambulatory Visit (INDEPENDENT_AMBULATORY_CARE_PROVIDER_SITE_OTHER): Payer: PRIVATE HEALTH INSURANCE

## 2022-12-30 DIAGNOSIS — J309 Allergic rhinitis, unspecified: Secondary | ICD-10-CM

## 2023-01-08 ENCOUNTER — Ambulatory Visit (INDEPENDENT_AMBULATORY_CARE_PROVIDER_SITE_OTHER): Payer: PRIVATE HEALTH INSURANCE

## 2023-01-08 DIAGNOSIS — J309 Allergic rhinitis, unspecified: Secondary | ICD-10-CM

## 2023-01-11 DIAGNOSIS — J301 Allergic rhinitis due to pollen: Secondary | ICD-10-CM | POA: Diagnosis not present

## 2023-01-11 NOTE — Progress Notes (Signed)
EXP 01/11/24

## 2023-01-13 ENCOUNTER — Ambulatory Visit (INDEPENDENT_AMBULATORY_CARE_PROVIDER_SITE_OTHER): Payer: Self-pay

## 2023-01-13 DIAGNOSIS — J309 Allergic rhinitis, unspecified: Secondary | ICD-10-CM

## 2023-01-15 NOTE — Progress Notes (Signed)
EXP 01/18/24

## 2023-01-20 ENCOUNTER — Ambulatory Visit (INDEPENDENT_AMBULATORY_CARE_PROVIDER_SITE_OTHER): Payer: PRIVATE HEALTH INSURANCE

## 2023-01-20 DIAGNOSIS — J309 Allergic rhinitis, unspecified: Secondary | ICD-10-CM

## 2023-01-29 ENCOUNTER — Ambulatory Visit (INDEPENDENT_AMBULATORY_CARE_PROVIDER_SITE_OTHER): Payer: PRIVATE HEALTH INSURANCE

## 2023-01-29 DIAGNOSIS — J309 Allergic rhinitis, unspecified: Secondary | ICD-10-CM

## 2023-02-05 ENCOUNTER — Ambulatory Visit (INDEPENDENT_AMBULATORY_CARE_PROVIDER_SITE_OTHER): Payer: Self-pay

## 2023-02-05 DIAGNOSIS — J309 Allergic rhinitis, unspecified: Secondary | ICD-10-CM

## 2023-02-15 ENCOUNTER — Encounter: Payer: Self-pay | Admitting: Family

## 2023-02-15 ENCOUNTER — Other Ambulatory Visit: Payer: Self-pay

## 2023-02-15 ENCOUNTER — Ambulatory Visit: Payer: PRIVATE HEALTH INSURANCE | Admitting: Family

## 2023-02-15 VITALS — Wt 196.0 lb

## 2023-02-15 DIAGNOSIS — J4541 Moderate persistent asthma with (acute) exacerbation: Secondary | ICD-10-CM | POA: Diagnosis not present

## 2023-02-15 DIAGNOSIS — J302 Other seasonal allergic rhinitis: Secondary | ICD-10-CM

## 2023-02-15 DIAGNOSIS — J3089 Other allergic rhinitis: Secondary | ICD-10-CM | POA: Diagnosis not present

## 2023-02-15 MED ORDER — AMOXICILLIN-POT CLAVULANATE 875-125 MG PO TABS: 1.0000 | ORAL_TABLET | Freq: Two times a day (BID) | ORAL | 0 refills | Status: DC

## 2023-02-15 MED ORDER — PREDNISONE 10 MG PO TABS: ORAL_TABLET | ORAL | 0 refills | Status: DC

## 2023-02-15 NOTE — Progress Notes (Signed)
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   I discussed the limitations, risks, security and privacy concerns of performing an evaluation and management service by telephone and the availability of in person appointments. I also discussed with the patient that there may be a patient responsible charge related to this service. The patient expressed understanding and agreed to proceed.  Patient is at home.  Provider is at the office.  Visit start time: 11:34 AM Visit end time: 12:03PM Insurance consent/check in by: Greg Cutter Medical consent and medical assistant/nurse: Roswell Nickel.  History of Present Illness: She is a 52 y.o. female, who is being followed for moderate persistent asthma and seasonal and perennial allergic rhinitis. Her previous allergy office visit was on October 16, 2022 with Dr. Dellis Anes.  She denies any new diagnosis or surgery since her last office visit.  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  She reports when her asthma starts to flare the first sign is that she will have a tickle. This past Saturday she started having the tickle and began drinking water.  At the time the cough was not productive.  She felt like a flare was coming on.  She then increased her Symbicort to 2 puffs twice a day.  She normally does her Symbicort 2 puffs once a  day.  Yesterday she stayed home and her cough became productive with a tan-yellow sputum.  The sputum is very thick.  She also reports a little bit of tightness in her chest, shortness of breath, and nocturnal awakenings due to cough.  Yesterday she had to sit down when she was changing sheets due to feeling a little short of breath.  She denies wheezing, body aches,fever, and chills.  She does mention she took Tylenol yesterday due to the tickle in her throat and sinus pressure.  Since her last office visit she has not required any systemic steroids or made any trips to the emergency room or urgent care due to breathing problems.  She has not used [place].  She continues to receive Fasenra injections every 8 weeks and reports that they do help.  She reports that she has not had an exacerbation in 1 year.  Seasonal and perennial allergic rhinitis: She also reports postnasal drip yesterday, but not this morning.  She denies rhinorrhea and nasal congestion, but mentions when she blows her nose she gets a tan-yellow out.  She also has sneezing and the sensation of her ears feeling under water.  She denies any drainage from her ears.  Her ears hurt off and on.  Yesterday she had a little bit of sinus pressure.  She denies sore throat.  She has not been treated for any sinus infections since we last saw her.  Yesterday she started her Flonase and Coricidin.  She continues to receive allergy injections per protocol and feel like the  allergy injections along with the Harrington Challenger have really helped her.  She does occasionally do saline rinses.  She has concerns that she may be infected and is on vacation this week.  She reports that her rapid Covid-19 test yesterday was negative.    Assessment and Plan: Christie Griffin is a 52 y.o. female with: Patient Instructions  1. Moderate persistent asthma- with acute exacerbation - Start prednisone 10 mg taking 2 tablets twice a day for 3 days, then on the 4th day take 2 tablets in  the morning, and on the 5th day take one tablet in the morning and stop - Daily controller medication(s): Symbicort 160/4.58mcg two puffs twice daily with spacer + Fasenra every 8 weeks  - Prior to physical activity: AirSupra 2 puffs 10-15 minutes before physical activity. - Rescue medications: AirSupra 4 puffs every 4-6 hours as needed - Asthma control goals:  * Full participation in all desired activities (may need albuterol before activity) * Albuterol use two time or less a week on average (not counting use with activity) * Cough interfering with sleep two time or less a month * Oral steroids no more than once a year * No hospitalizations  2. Seasonal and perennial allergic rhinitis - Continue with allergy shots at the same schedule. - Continue with Flonase one spray per nostril up to twice daily.  - will give you a prescription for Augmentin 875 mg 1 tablet twice a day for 7 days to have on hand since you are on vacation. Do not start Augmentin until after 7 days of symptoms. - Start saline rinses once to twice a day. Use before medicated nasal sprays.  3. Keep already scheduled follow up appointment on 04/28/23 @ 11:15 am with Dr. Dellis Anes       Return in about 2 months (around 04/28/2023), or if symptoms worsen or fail to improve.  Meds ordered this encounter  Medications   predniSONE (DELTASONE) 10 MG tablet    Sig: Take 2 tablets twice a day for 3 days, then on the 4th day take 2 tablets in the morning, and on the 5th day take one tablet and stop    Dispense:  15 tablet    Refill:  0   amoxicillin-clavulanate (AUGMENTIN) 875-125 MG tablet    Sig: Take 1 tablet by mouth 2 (two) times daily.    Dispense:  14 tablet    Refill:  0   Lab Orders  No laboratory test(s) ordered today    Diagnostics: None.  Medication List:  Current Outpatient Medications  Medication Sig Dispense Refill   acyclovir ointment (ZOVIRAX) 5 % Apply 1 Application topically every 3 (three) hours.      Albuterol-Budesonide (AIRSUPRA) 90-80 MCG/ACT AERO Inhale 2 puffs into the lungs every 4 (four) hours as needed. 10.7 g 5   amLODipine (NORVASC) 2.5 MG tablet Take 1 tablet (2.5 mg total) by mouth daily. 90 tablet 3   amoxicillin-clavulanate (AUGMENTIN) 875-125 MG tablet Take 1 tablet by mouth 2 (two) times daily. 14 tablet 0   Benralizumab (FASENRA PEN) 30 MG/ML SOAJ Inject 1 mL (30 mg total) into the skin every 28 (twenty-eight) days. For 3 doses then every 8 weeks (Patient taking differently: Inject 30 mg into the skin every 28 (twenty-eight) days. For 3 doses then  Every 8 weeks) 1 mL 9   budesonide-formoterol (SYMBICORT) 160-4.5 MCG/ACT inhaler Inhale 2 puffs into the lugs 2 times daily 30.6 g 1   cetirizine (ZYRTEC) 10 MG tablet Take 1 tablet (  10 mg total) by mouth daily. 90 tablet 1   Cyanocobalamin (VITAMIN B-12 PO) Take by mouth.     EPINEPHrine (EPIPEN 2-PAK) 0.3 mg/0.3 mL IJ SOAJ injection Inject 0.3 mg into the muscle as needed for anaphylaxis. 2 each 1   fluticasone (FLONASE) 50 MCG/ACT nasal spray Place 2 sprays into both nostrils daily. (Patient taking differently: Place 2 sprays into both nostrils.) 48 g 1   hydrochlorothiazide (MICROZIDE) 12.5 MG capsule Take 1 capsule (12.5 mg total) by mouth daily. 90 capsule 3   MAGNESIUM GLYCINATE PO Take 200 mg by mouth daily.     montelukast (SINGULAIR) 10 MG tablet Take 1 tablet (10 mg total) by mouth at bedtime. 90 tablet 1   norethindrone (MICRONOR) 0.35 MG tablet Take 1 tablet (0.35 mg total) by mouth daily. 84 tablet 4   predniSONE (DELTASONE) 10 MG tablet Take 2 tablets twice a day for 3 days, then on the 4th day take 2 tablets in the morning, and on the 5th day take one tablet and stop 15 tablet 0   VITAMIN D PO Take by mouth.     No current facility-administered medications for this visit.   Allergies: Not on File I reviewed her past medical history, social history, family history, and environmental history and no significant  changes have been reported from previous visit on 10/16/22.  Review of Systems Negative except as per HPI Objective: Physical Exam Not obtained as encounter was done via telephone.   Previous notes and tests were reviewed.  I discussed the assessment and treatment plan with the patient. The patient was provided an opportunity to ask questions and all were answered. The patient agreed with the plan and demonstrated an understanding of the instructions.   The patient was advised to call back or seek an in-person evaluation if the symptoms worsen or if the condition fails to improve as anticipated.  I provided 29 minutes of non-face-to-face time during this encounter.  It was my pleasure to participate in Wakita Labree's care today. Please feel free to contact me with any questions or concerns.   Sincerely,  Nehemiah Settle, FNP

## 2023-02-15 NOTE — Patient Instructions (Addendum)
1. Moderate persistent asthma- with acute exacerbation - Start prednisone 10 mg taking 2 tablets twice a day for 3 days, then on the 4th day take 2 tablets in the morning, and on the 5th day take one tablet in the morning and stop - Daily controller medication(s): Symbicort 160/4.61mcg two puffs twice daily with spacer + Fasenra every 8 weeks  - Prior to physical activity: AirSupra 2 puffs 10-15 minutes before physical activity. - Rescue medications: AirSupra 4 puffs every 4-6 hours as needed - Asthma control goals:  * Full participation in all desired activities (may need albuterol before activity) * Albuterol use two time or less a week on average (not counting use with activity) * Cough interfering with sleep two time or less a month * Oral steroids no more than once a year * No hospitalizations  2. Seasonal and perennial allergic rhinitis - Continue with allergy shots at the same schedule. - Continue with Flonase one spray per nostril up to twice daily.  - will give you a prescription for Augmentin 875 mg 1 tablet twice a day for 7 days to have on hand since you are on vacation. Do not start Augmentin until after 7 days of symptoms. - Start saline rinses once to twice a day. Use before medicated nasal sprays.  3. Keep already scheduled follow up appointment on 04/28/23 @ 11:15 am with Dr. Dellis Anes

## 2023-02-19 ENCOUNTER — Ambulatory Visit (INDEPENDENT_AMBULATORY_CARE_PROVIDER_SITE_OTHER): Payer: Self-pay

## 2023-02-19 DIAGNOSIS — J309 Allergic rhinitis, unspecified: Secondary | ICD-10-CM

## 2023-03-03 ENCOUNTER — Ambulatory Visit (INDEPENDENT_AMBULATORY_CARE_PROVIDER_SITE_OTHER): Payer: PRIVATE HEALTH INSURANCE

## 2023-03-03 DIAGNOSIS — J309 Allergic rhinitis, unspecified: Secondary | ICD-10-CM

## 2023-03-04 ENCOUNTER — Other Ambulatory Visit: Payer: Self-pay | Admitting: Allergy & Immunology

## 2023-03-15 ENCOUNTER — Other Ambulatory Visit: Payer: Self-pay | Admitting: *Deleted

## 2023-03-15 ENCOUNTER — Encounter: Payer: Self-pay | Admitting: Allergy & Immunology

## 2023-03-15 MED ORDER — FASENRA PEN 30 MG/ML ~~LOC~~ SOAJ: 30.0000 mg | SUBCUTANEOUS | 6 refills | Status: DC

## 2023-03-15 NOTE — Telephone Encounter (Signed)
This has already been taken care of. Never received anything from [facility] but sent refill and confirmed receipt

## 2023-03-17 ENCOUNTER — Ambulatory Visit (INDEPENDENT_AMBULATORY_CARE_PROVIDER_SITE_OTHER): Payer: PRIVATE HEALTH INSURANCE | Admitting: *Deleted

## 2023-03-17 DIAGNOSIS — J309 Allergic rhinitis, unspecified: Secondary | ICD-10-CM

## 2023-03-23 NOTE — Telephone Encounter (Signed)
Thanks, Tammy!   Akeel Reffner, MD [facility]  

## 2023-04-02 ENCOUNTER — Ambulatory Visit (INDEPENDENT_AMBULATORY_CARE_PROVIDER_SITE_OTHER): Payer: Self-pay

## 2023-04-02 DIAGNOSIS — J309 Allergic rhinitis, unspecified: Secondary | ICD-10-CM

## 2023-04-23 ENCOUNTER — Ambulatory Visit (INDEPENDENT_AMBULATORY_CARE_PROVIDER_SITE_OTHER): Payer: PRIVATE HEALTH INSURANCE

## 2023-04-23 DIAGNOSIS — J309 Allergic rhinitis, unspecified: Secondary | ICD-10-CM | POA: Diagnosis not present

## 2023-04-28 ENCOUNTER — Other Ambulatory Visit: Payer: Self-pay

## 2023-04-28 ENCOUNTER — Encounter: Payer: Self-pay | Admitting: Allergy & Immunology

## 2023-04-28 ENCOUNTER — Ambulatory Visit: Payer: PRIVATE HEALTH INSURANCE | Admitting: Allergy & Immunology

## 2023-04-28 VITALS — BP 116/68 | HR 114 | Temp 98.0°F | Resp 18 | Ht 67.0 in | Wt 205.2 lb

## 2023-04-28 DIAGNOSIS — J4550 Severe persistent asthma, uncomplicated: Secondary | ICD-10-CM | POA: Diagnosis not present

## 2023-04-28 DIAGNOSIS — J3089 Other allergic rhinitis: Secondary | ICD-10-CM | POA: Diagnosis not present

## 2023-04-28 DIAGNOSIS — J302 Other seasonal allergic rhinitis: Secondary | ICD-10-CM

## 2023-04-28 MED ORDER — MONTELUKAST SODIUM 10 MG PO TABS: 10.0000 mg | ORAL_TABLET | Freq: Every day | ORAL | 1 refills | Status: DC

## 2023-04-28 MED ORDER — BUDESONIDE-FORMOTEROL FUMARATE 160-4.5 MCG/ACT IN AERO: INHALATION_SPRAY | RESPIRATORY_TRACT | 1 refills | Status: DC

## 2023-04-28 MED ORDER — EPINEPHRINE 0.3 MG/0.3ML IJ SOAJ: 0.3000 mg | INTRAMUSCULAR | 1 refills | Status: AC | PRN

## 2023-04-28 MED ORDER — CETIRIZINE HCL 10 MG PO TABS: 10.0000 mg | ORAL_TABLET | Freq: Every day | ORAL | 1 refills | Status: DC

## 2023-04-28 MED ORDER — FLUTICASONE PROPIONATE 50 MCG/ACT NA SUSP: 2.0000 | Freq: Every day | NASAL | 1 refills | Status: AC

## 2023-04-28 NOTE — Patient Instructions (Addendum)
 1. Moderate persistent asthma, uncomplicated - Lung testing looks stable today. - Let's do TWO more doses of Fasenra  to get you through the cold and flu season and the allergy  season.  - Daily controller medication(s): Symbicort  160/4.58mcg two puffs twice daily with spacer + Fasenra  every 8 weeks  - Prior to physical activity: AirSupra  2 puffs 10-15 minutes before physical activity. - Rescue medications: AirSupra  4 puffs every 4-6 hours as needed - Asthma control goals:  * Full participation in all desired activities (may need albuterol  before activity) * Albuterol  use two time or less a week on average (not counting use with activity) * Cough interfering with sleep two time or less a month * Oral steroids no more than once a year * No hospitalizations  2. Seasonal and perennial allergic rhinitis - You reached maintenance in March 2024, so we will plan to continue through March 2029 for the full five years. - It will change to every month after the next new vial.  - Continue with Flonase  one spray per nostril up to twice daily.   3. Return in about 6 months (around 10/26/2023). You can have the follow up appointment with Dr. Iva or a Nurse Practicioner (our Nurse Practitioners are excellent and always have Physician oversight!).    Please inform us  of any Emergency Department visits, hospitalizations, or changes in symptoms. Call us  before going to the ED for breathing or allergy  symptoms since we might be able to fit you in for a sick visit. Feel free to contact us  anytime with any questions, problems, or concerns.  It was a pleasure to see you again today!  Websites that have reliable patient information: 1. American Academy of Asthma, Allergy , and Immunology: www.aaaai.org 2. Food Allergy  Research and Education (FARE): foodallergy.org 3. Mothers of Asthmatics: http://www.asthmacommunitynetwork.org 4. [employer] of Allergy , Asthma, and Immunology:  www.acaai.org      "Like" us  on Facebook and Instagram for our latest updates!      A healthy democracy works best when [employer] participate! Make sure you are registered to vote! If you have moved or changed any of your contact information, you will need to get this updated before voting! Scan the QR codes below to learn more!

## 2023-04-28 NOTE — Progress Notes (Signed)
 FOLLOW UP  Date of Service/Encounter:  04/28/23   Assessment:   Moderate persistent asthma, uncomplicated - on Fasenra  every 8 weeks (administers at home) - doing Fasenra  for two more doses   Perennial and seasonal allergic rhinitis (grasses, trees, dog, dust mites) - on allergen immunotherapy with maintenance reached March 2024 with marked improvement of symptoms   Plan/Recommendations:   1. Moderate persistent asthma, uncomplicated - Lung testing looks stable today. - Let's do TWO more doses of Fasenra  to get you through the cold and flu season and the allergy  season.  - Daily controller medication(s): Symbicort  160/4.43mcg two puffs twice daily with spacer + Fasenra  every 8 weeks  - Prior to physical activity: AirSupra  2 puffs 10-15 minutes before physical activity. - Rescue medications: AirSupra  4 puffs every 4-6 hours as needed - Asthma control goals:  * Full participation in all desired activities (may need albuterol  before activity) * Albuterol  use two time or less a week on average (not counting use with activity) * Cough interfering with sleep two time or less a month * Oral steroids no more than once a year * No hospitalizations  2. Seasonal and perennial allergic rhinitis - You reached maintenance in March 2024, so we will plan to continue through March 2029 for the full five years. - It will change to every month after the next new vial.  - Continue with Flonase  one spray per nostril up to twice daily.   3. Return in about 6 months (around 10/26/2023). You can have the follow up appointment with Dr. Iva or a Nurse Practicioner (our Nurse Practitioners are excellent and always have Physician oversight!).    Subjective:   Christie Griffin is a 53 y.o. female presenting today for follow up of  Chief Complaint  Patient presents with   Asthma   Allergic Rhinitis     Started every 2 weeks for allergy  shots in Oct. Ended up getting sick with a sinus  infection. Did the antibiotic and steroid. Has a reaction last Friday after allergy  injection has pictures.     Christie Griffin has a history of the following: Patient Active Problem List   Diagnosis Date Noted   Irregular periods 11/12/2022   Breast discharge 03/09/2022   Peri-menopause 11/04/2021   Angiokeratoma 10/31/2020   Elevated hemoglobin A1c 10/31/2020   Irregular bleeding 10/25/2019   Encounter for screening fecal occult blood testing 10/25/2019   Encounter for well woman exam with routine gynecological exam 10/25/2019   Screening for colorectal cancer 10/17/2018   Encounter for gynecological examination with Papanicolaou smear of cervix 10/17/2018   Fibroids, intramural 11/11/2017   Hemorrhoidal skin tags 10/07/2016   Encounter for menstrual regulation 10/07/2016   Fecal occult blood test positive 10/07/2016   Cough variant asthma 11/29/2015   Allergic rhinoconjunctivitis 11/29/2015   Vitamin D  deficiency 09/30/2015   Hemorrhoid 09/20/2013   Heavy menses 09/20/2013   Wheezing 09/20/2013   Essential hypertension 08/18/2012    History obtained from: chart review and patient.  Discussed the use of AI scribe software for clinical note transcription with the patient and/or guardian, who gave verbal consent to proceed.  Christie Griffin is a 53 y.o. female presenting for a follow up visit.  She was last seen in October 2024.  She had a virtual visit with Christie Griffin at that time.  She was diagnosed with asthma exacerbation and started on a prednisone  taper.  She was continued on her Symbicort  2 puffs twice daily as well as  Fasenra .  For her rhinitis, she is behind her allergy  shots.  Christie Griffin also describes Augmentin  to use in case her symptoms do not prove with the prednisone .  Since last visit, she has done fairly well.  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  Asthma/Respiratory Symptom History: She remains on the Symbicort  2  puffs twice daily.  This seems to be working well to control her asthma.  She was admitted to the emergency room.  She is using Symbicort  2 puffs twice daily.  This seems to be working well. Honestie's asthma has been well controlled. She has not required rescue medication, experienced nocturnal awakenings due to lower respiratory symptoms, nor have activities of daily living been limited. She has required no Emergency Department or Urgent Care visits for her asthma. She has required zero courses of systemic steroids for asthma exacerbations since the last visit. ACT score today is 25, indicating excellent asthma symptom control.  She also recommended for the Fasenra .  She has not started to get.  Our plan was to use Fasenra  bridging agent until she is on maintenance for her allergy  shots. Spring is definitely the worse time of the year for her symptoms, although 2024 was a good spring for her with the allergy  shots on board.   Allergic Rhinitis Symptom History: Overall, her allergy  symptoms have been excellent with allergy  shots.  She does have the antihistamine andto use as needed.  She does feel that the shots have been well worth her time.  Aside from the antibiotics that she received when she saw Christie Griffin, she has not needed antibiotics at all since the last visit.  Christie Griffin is on allergen immunotherapy. She receives one injection. Immunotherapy script #1 contains weeds, grasses, dust mites, and dog. She currently receives 0.50mL of the RED vial (1/100). She started shots October of 2023 and reached maintenance in March 2024. She is getting shots every two weeks now.   She reports a recent localized reaction to the allergy  shot, manifesting as a skin rash at the injection site. The rash was itchy but did not cause any respiratory distress or other systemic symptoms. This reaction has occurred once before.  She did not have any systemic reactions.  She estimates that it was the size of a half dollar.  It  resolved after a couple of days.  Her family was working out of it, but she was not worried at all.  Her work schedule has been demanding, and she has been using accrued leave to ensure she does not exceed the maximum limit set by her employer. She has not taken any sick leave in the past two to three years, even during the COVID-19 pandemic.   Otherwise, there have been no changes to her past medical history, surgical history, family history, or social history.    Review of systems otherwise negative other than that mentioned in the HPI.    Objective:   Blood pressure 116/68, pulse (!) 114, temperature 98 F (36.7 C), resp. rate 18, height 5' 7 (1.702 m), weight 205 lb 3.2 oz (93.1 kg), SpO2 97%. Body mass index is 32.14 kg/m.    Physical Exam Vitals reviewed.  Constitutional:      Appearance: She is well-developed.     Comments: Pleasant. Smiling. Talkative.   HENT:     Head: Normocephalic and atraumatic.     Right Ear: Tympanic membrane, ear canal and external ear normal.  Left Ear: Tympanic membrane, ear canal and external ear normal.     Nose: No nasal deformity, septal deviation, mucosal edema or rhinorrhea.     Right Turbinates: Enlarged, swollen and pale.     Left Turbinates: Enlarged, swollen and pale.     Right Sinus: No maxillary sinus tenderness or frontal sinus tenderness.     Left Sinus: No maxillary sinus tenderness or frontal sinus tenderness.     Comments: No nasal polyps noted.     Mouth/Throat:     Mouth: Mucous membranes are not pale and not dry.     Pharynx: Uvula midline.  Eyes:     General: Lids are normal. Allergic shiner present.        Right eye: No discharge.        Left eye: No discharge.     Conjunctiva/sclera: Conjunctivae normal.     Right eye: Right conjunctiva is not injected. No chemosis.    Left eye: Left conjunctiva is not injected. No chemosis.    Pupils: Pupils are equal, round, and reactive to light.  Cardiovascular:     Rate  and Rhythm: Normal rate and regular rhythm.     Heart sounds: Normal heart sounds.  Pulmonary:     Effort: Pulmonary effort is normal. No tachypnea, accessory muscle usage or respiratory distress.     Breath sounds: Normal breath sounds. No wheezing, rhonchi or rales.     Comments: Moving air well in all lung fields.  Chest:     Chest wall: No tenderness.  Lymphadenopathy:     Cervical: No cervical adenopathy.  Skin:    General: Skin is warm.     Capillary Refill: Capillary refill takes less than 2 seconds.     Coloration: Skin is not pale.     Findings: No abrasion, erythema, petechiae or rash. Rash is not papular, urticarial or vesicular.  Neurological:     Mental Status: She is alert.  Psychiatric:        Behavior: Behavior is cooperative.      Diagnostic studies:    Spirometry: results normal (FEV1: 2.29/77%, FVC: 2.83/76%, FEV1/FVC: 81%).    Spirometry consistent with normal pattern.   Allergy  Studies: none       Marty Shaggy, MD  [facility] 

## 2023-05-05 ENCOUNTER — Ambulatory Visit (INDEPENDENT_AMBULATORY_CARE_PROVIDER_SITE_OTHER): Payer: PRIVATE HEALTH INSURANCE

## 2023-05-05 DIAGNOSIS — J309 Allergic rhinitis, unspecified: Secondary | ICD-10-CM

## 2023-05-26 ENCOUNTER — Ambulatory Visit (INDEPENDENT_AMBULATORY_CARE_PROVIDER_SITE_OTHER): Payer: PRIVATE HEALTH INSURANCE

## 2023-05-26 DIAGNOSIS — J309 Allergic rhinitis, unspecified: Secondary | ICD-10-CM | POA: Diagnosis not present

## 2023-06-09 ENCOUNTER — Ambulatory Visit (INDEPENDENT_AMBULATORY_CARE_PROVIDER_SITE_OTHER): Payer: PRIVATE HEALTH INSURANCE

## 2023-06-09 DIAGNOSIS — J309 Allergic rhinitis, unspecified: Secondary | ICD-10-CM | POA: Diagnosis not present

## 2023-06-23 ENCOUNTER — Ambulatory Visit (INDEPENDENT_AMBULATORY_CARE_PROVIDER_SITE_OTHER): Payer: Self-pay

## 2023-06-23 DIAGNOSIS — J309 Allergic rhinitis, unspecified: Secondary | ICD-10-CM

## 2023-07-02 ENCOUNTER — Ambulatory Visit (INDEPENDENT_AMBULATORY_CARE_PROVIDER_SITE_OTHER): Payer: PRIVATE HEALTH INSURANCE

## 2023-07-02 DIAGNOSIS — J309 Allergic rhinitis, unspecified: Secondary | ICD-10-CM

## 2023-07-07 ENCOUNTER — Ambulatory Visit (INDEPENDENT_AMBULATORY_CARE_PROVIDER_SITE_OTHER): Payer: Self-pay

## 2023-07-07 DIAGNOSIS — J309 Allergic rhinitis, unspecified: Secondary | ICD-10-CM | POA: Diagnosis not present

## 2023-08-11 ENCOUNTER — Ambulatory Visit (INDEPENDENT_AMBULATORY_CARE_PROVIDER_SITE_OTHER): Payer: PRIVATE HEALTH INSURANCE

## 2023-08-11 DIAGNOSIS — J309 Allergic rhinitis, unspecified: Secondary | ICD-10-CM | POA: Diagnosis not present

## 2023-08-27 ENCOUNTER — Ambulatory Visit (INDEPENDENT_AMBULATORY_CARE_PROVIDER_SITE_OTHER): Payer: Self-pay

## 2023-08-27 DIAGNOSIS — J309 Allergic rhinitis, unspecified: Secondary | ICD-10-CM | POA: Diagnosis not present

## 2023-09-02 ENCOUNTER — Other Ambulatory Visit (HOSPITAL_COMMUNITY): Payer: Self-pay | Admitting: Adult Health

## 2023-09-02 DIAGNOSIS — Z1231 Encounter for screening mammogram for malignant neoplasm of breast: Secondary | ICD-10-CM

## 2023-09-03 ENCOUNTER — Other Ambulatory Visit: Payer: Self-pay | Admitting: Adult Health

## 2023-09-03 MED ORDER — AMLODIPINE BESYLATE 2.5 MG PO TABS: 2.5000 mg | ORAL_TABLET | Freq: Every day | ORAL | 3 refills | Status: AC

## 2023-09-03 MED ORDER — HYDROCHLOROTHIAZIDE 12.5 MG PO CAPS: 12.5000 mg | ORAL_CAPSULE | Freq: Every day | ORAL | 3 refills | Status: AC

## 2023-09-03 NOTE — Progress Notes (Signed)
 Refilled norvasc  and microzide 

## 2023-09-08 ENCOUNTER — Ambulatory Visit (INDEPENDENT_AMBULATORY_CARE_PROVIDER_SITE_OTHER): Payer: PRIVATE HEALTH INSURANCE

## 2023-09-08 DIAGNOSIS — J309 Allergic rhinitis, unspecified: Secondary | ICD-10-CM | POA: Diagnosis not present

## 2023-09-22 ENCOUNTER — Ambulatory Visit (INDEPENDENT_AMBULATORY_CARE_PROVIDER_SITE_OTHER): Payer: PRIVATE HEALTH INSURANCE

## 2023-09-22 DIAGNOSIS — J309 Allergic rhinitis, unspecified: Secondary | ICD-10-CM

## 2023-10-13 ENCOUNTER — Ambulatory Visit (INDEPENDENT_AMBULATORY_CARE_PROVIDER_SITE_OTHER): Payer: PRIVATE HEALTH INSURANCE

## 2023-10-13 DIAGNOSIS — J309 Allergic rhinitis, unspecified: Secondary | ICD-10-CM | POA: Diagnosis not present

## 2023-10-27 ENCOUNTER — Ambulatory Visit (INDEPENDENT_AMBULATORY_CARE_PROVIDER_SITE_OTHER): Payer: Self-pay

## 2023-10-27 DIAGNOSIS — J309 Allergic rhinitis, unspecified: Secondary | ICD-10-CM

## 2023-11-03 ENCOUNTER — Ambulatory Visit: Payer: PRIVATE HEALTH INSURANCE | Admitting: Allergy & Immunology

## 2023-11-03 ENCOUNTER — Encounter: Payer: Self-pay | Admitting: Allergy & Immunology

## 2023-11-03 VITALS — BP 120/82 | HR 92 | Temp 97.8°F | Resp 18 | Ht 67.32 in | Wt 206.4 lb

## 2023-11-03 DIAGNOSIS — J4550 Severe persistent asthma, uncomplicated: Secondary | ICD-10-CM

## 2023-11-03 DIAGNOSIS — J309 Allergic rhinitis, unspecified: Secondary | ICD-10-CM | POA: Diagnosis not present

## 2023-11-03 DIAGNOSIS — J3089 Other allergic rhinitis: Secondary | ICD-10-CM

## 2023-11-03 DIAGNOSIS — J302 Other seasonal allergic rhinitis: Secondary | ICD-10-CM

## 2023-11-03 NOTE — Progress Notes (Signed)
 FOLLOW UP  Date of Service/Encounter:  11/03/23   Assessment:   Moderate persistent asthma, uncomplicated - on Fasenra  every 8 weeks (administers at home) - doing Fasenra  for two more doses   Perennial and seasonal allergic rhinitis (grasses, trees, dog, dust mites) - on allergen immunotherapy with maintenance reached March 2024 with marked improvement of symptoms   Plan/Recommendations:   1. Moderate persistent asthma, uncomplicated - Lung testing looks stable today. - We are going to d/c the Fasenra  completely.  - Daily controller medication(s): Symbicort  160/4.76mcg two puffs twice daily with spacer - Prior to physical activity: AirSupra  2 puffs 10-15 minutes before physical activity. - Rescue medications: AirSupra  4 puffs every 4-6 hours as needed - Asthma control goals:  * Full participation in all desired activities (may need albuterol  before activity) * Albuterol  use two time or less a week on average (not counting use with activity) * Cough interfering with sleep two time or less a month * Oral steroids no more than once a year * No hospitalizations  2. Seasonal and perennial allergic rhinitis - You reached maintenance in March 2024, so we will plan to continue through March 2029 for the full five years. - It will change to every month after the next new vial.  - Continue with Flonase  one spray per nostril up to twice daily.  - Continue with the cetirizine  10mg  daily.   3. Return in about 3 months (around 02/03/2024) since we are stopping the Fasenra , but we will space out to every 6 months after that.     Subjective:   Christie Griffin is a 53 y.o. female presenting today for follow up of  Chief Complaint  Patient presents with   Establish Care   Follow-up    No issues, medication doing well     Christie Griffin has a history of the following: Patient Active Problem List   Diagnosis Date Noted   Irregular periods 11/12/2022   Breast  discharge 03/09/2022   Peri-menopause 11/04/2021   Angiokeratoma 10/31/2020   Elevated hemoglobin A1c 10/31/2020   Irregular bleeding 10/25/2019   Encounter for screening fecal occult blood testing 10/25/2019   Encounter for well woman exam with routine gynecological exam 10/25/2019   Screening for colorectal cancer 10/17/2018   Encounter for gynecological examination with Papanicolaou smear of cervix 10/17/2018   Fibroids, intramural 11/11/2017   Hemorrhoidal skin tags 10/07/2016   Encounter for menstrual regulation 10/07/2016   Fecal occult blood test positive 10/07/2016   Cough variant asthma 11/29/2015   Allergic rhinoconjunctivitis 11/29/2015   Vitamin D  deficiency 09/30/2015   Hemorrhoid 09/20/2013   Heavy menses 09/20/2013   Wheezing 09/20/2013   Essential hypertension 08/18/2012    History obtained from: chart review and patient.  Discussed the use of AI scribe software for clinical note transcription with the patient and/or guardian, who gave verbal consent to proceed.  Christie Griffin is a 53 y.o. female presenting for a follow up visit.  She was last seen in January 2025.  At that time, Lyme testing looks stable.  We decided to do 2 more doses of Fasenra  to get her through the cold and flu season in the allergy  season.  We continue with Symbicort  160 mcg 2 puffs twice daily.  For her rhinitis, she had reached maintenance in March 2024.  We continue with Flonase  1 spray per nostril up to twice daily.  We had started her on allergy  shots to help bridge her off of Fasenra .  Since  her last visit, she has done well.   Asthma/Respiratory Symptom History: She remains on the Symbicort . She has been continuing on the Fasenra . Last injection was last week. She remains off of the Symbicort . She does report some SOB when she walks into work when it is humid and hot outside. Her breathing has been slightly lower, which she attributes to stress and heat. She experiences shortness of breath  occasionally, such as when walking into work, but has not needed to use her inhaler. She recently stopped taking Fasenra , having taken it two times more than planned due to receiving additional doses. Her last shot was last week. She continues to use Symbicort  and has AirSupra  available but has not needed to use it.  Allergic Rhinitis Symptom History: She remains on Zyrtec  and Flonase .  She is doing well with her allergy  shots.  She feels like they have been very efficacious.    Christie Griffin is on allergen immunotherapy. She receives one injection. Immunotherapy script #1 contains weeds, grasses, dust mites, and dog. She currently receives 0.50mL of the RED vial (1/100). She started shots October of 2023 and reached maintenance in March 2024. She is getting shots every two weeks now and will be changing to monthly very soon.  Occasionally she will have some large local reactions, but mostly she does fine with this.  Her father-in-law is currently hospitalized due to complications from a gallstone, affecting his blood pressure and requiring dialysis. She is involved in coordinating his care, which has been stressful for her.  She took three weeks off work recently to manage her sick time, as she was nearing the maximum accrual. During this time, she traveled out of town for ten days. 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   Otherwise, there have been no changes to her past medical history, surgical history, family history, or social history.    Review of systems otherwise negative other than that mentioned in the HPI.    Objective:   Blood pressure 120/82, pulse 92, temperature 97.8 F (36.6 C), temperature source Temporal, resp. rate 18, height 5' 7.32 (1.71 m), weight 206 lb 6.4 oz (93.6 kg), SpO2 98%. Body mass index is 32.02 kg/m.    Physical Exam Vitals reviewed.  Constitutional:      Appearance: She is well-developed.      Comments: Pleasant. Smiling. Talkative.   HENT:     Head: Normocephalic and atraumatic.     Right Ear: Tympanic membrane, ear canal and external ear normal.     Left Ear: Tympanic membrane, ear canal and external ear normal.     Nose: No nasal deformity, septal deviation, mucosal edema or rhinorrhea.     Right Turbinates: Enlarged, swollen and pale.     Left Turbinates: Enlarged, swollen and pale.     Right Sinus: No maxillary sinus tenderness or frontal sinus tenderness.     Left Sinus: No maxillary sinus tenderness or frontal sinus tenderness.     Comments: No nasal polyps noted.     Mouth/Throat:     Mouth: Mucous membranes are not pale and not dry.     Pharynx: Uvula midline.  Eyes:     General: Lids are normal. Allergic shiner present.        Right eye: No discharge.        Left eye: No discharge.     Conjunctiva/sclera: Conjunctivae normal.     Right eye: Right conjunctiva is  not injected. No chemosis.    Left eye: Left conjunctiva is not injected. No chemosis.    Pupils: Pupils are equal, round, and reactive to light.  Cardiovascular:     Rate and Rhythm: Normal rate and regular rhythm.     Heart sounds: Normal heart sounds.  Pulmonary:     Effort: Pulmonary effort is normal. No tachypnea, accessory muscle usage or respiratory distress.     Breath sounds: Normal breath sounds. No wheezing, rhonchi or rales.     Comments: Moving air well in all lung fields.  Chest:     Chest wall: No tenderness.  Lymphadenopathy:     Cervical: No cervical adenopathy.  Skin:    General: Skin is warm.     Capillary Refill: Capillary refill takes less than 2 seconds.     Coloration: Skin is not pale.     Findings: No abrasion, erythema, petechiae or rash. Rash is not papular, urticarial or vesicular.  Neurological:     Mental Status: She is alert.  Psychiatric:        Behavior: Behavior is cooperative.      Diagnostic studies:    Spirometry: results normal (FEV1: 2.03/69%, FVC:  2.55/68%, FEV1/FVC: 80%).    Spirometry consistent with possible restrictive disease.    Allergy  Studies: none       Marty Shaggy, MD  [facility] 

## 2023-11-03 NOTE — Addendum Note (Signed)
 Addended by: JENEL MARYLYNN GRADE on: 11/03/2023 04:34 PM   Modules accepted: Orders

## 2023-11-03 NOTE — Patient Instructions (Addendum)
 1. Moderate persistent asthma, uncomplicated - Lung testing looks stable today. - We are going to d/c the Fasenra  completely.  - Daily controller medication(s): Symbicort  160/4.67mcg two puffs twice daily with spacer - Prior to physical activity: AirSupra  2 puffs 10-15 minutes before physical activity. - Rescue medications: AirSupra  4 puffs every 4-6 hours as needed - Asthma control goals:  * Full participation in all desired activities (may need albuterol  before activity) * Albuterol  use two time or less a week on average (not counting use with activity) * Cough interfering with sleep two time or less a month * Oral steroids no more than once a year * No hospitalizations  2. Seasonal and perennial allergic rhinitis - You reached maintenance in March 2024, so we will plan to continue through March 2029 for the full five years. - It will change to every month after the next new vial.  - Continue with Flonase  one spray per nostril up to twice daily.  - Continue with the cetirizine  10mg  daily.   3. Return in about 3 months (around 02/03/2024) since we are stopping the Fasenra , but we will space out to every 6 months after that.    Please inform us  of any Emergency Department visits, hospitalizations, or changes in symptoms. Call us  before going to the ED for breathing or allergy  symptoms since we might be able to fit you in for a sick visit. Feel free to contact us  anytime with any questions, problems, or concerns.  It was a pleasure to see you again today!  Websites that have reliable patient information: 1. American Academy of Asthma, Allergy , and Immunology: www.aaaai.org 2. Food Allergy  Research and Education (FARE): foodallergy.org 3. Mothers of Asthmatics: http://www.asthmacommunitynetwork.org 4. American College of Allergy , Asthma, and Immunology: www.acaai.org      "Like" us  on Facebook and Instagram for our latest updates!      A healthy democracy works best when [employer] participate! Make sure you are registered to vote! If you have moved or changed any of your contact information, you will need to get this updated before voting! Scan the QR codes below to learn more!

## 2023-11-04 DIAGNOSIS — J3089 Other allergic rhinitis: Secondary | ICD-10-CM | POA: Diagnosis not present

## 2023-11-04 NOTE — Progress Notes (Signed)
 VIAL MADE 11-04-23

## 2023-11-18 ENCOUNTER — Ambulatory Visit: Payer: PRIVATE HEALTH INSURANCE | Admitting: Adult Health

## 2023-11-18 ENCOUNTER — Encounter: Payer: Self-pay | Admitting: Adult Health

## 2023-11-18 ENCOUNTER — Ambulatory Visit (HOSPITAL_COMMUNITY)
Admission: RE | Admit: 2023-11-18 | Discharge: 2023-11-18 | Disposition: A | Discharge status: Home / Self Care | Payer: PRIVATE HEALTH INSURANCE | Source: Ambulatory Visit | Attending: Adult Health | Admitting: Adult Health

## 2023-11-18 ENCOUNTER — Encounter (HOSPITAL_COMMUNITY): Payer: Self-pay

## 2023-11-18 ENCOUNTER — Ambulatory Visit (HOSPITAL_COMMUNITY): Payer: PRIVATE HEALTH INSURANCE

## 2023-11-18 VITALS — BP 123/83 | HR 89 | Ht 67.0 in | Wt 207.0 lb

## 2023-11-18 DIAGNOSIS — Z1211 Encounter for screening for malignant neoplasm of colon: Secondary | ICD-10-CM

## 2023-11-18 DIAGNOSIS — Z1331 Encounter for screening for depression: Secondary | ICD-10-CM | POA: Diagnosis not present

## 2023-11-18 DIAGNOSIS — N951 Menopausal and female climacteric states: Secondary | ICD-10-CM | POA: Diagnosis not present

## 2023-11-18 DIAGNOSIS — Z1231 Encounter for screening mammogram for malignant neoplasm of breast: Secondary | ICD-10-CM | POA: Insufficient documentation

## 2023-11-18 DIAGNOSIS — Z01419 Encounter for gynecological examination (general) (routine) without abnormal findings: Secondary | ICD-10-CM

## 2023-11-18 DIAGNOSIS — N926 Irregular menstruation, unspecified: Secondary | ICD-10-CM

## 2023-11-18 DIAGNOSIS — I10 Essential (primary) hypertension: Secondary | ICD-10-CM

## 2023-11-18 DIAGNOSIS — Z7689 Persons encountering health services in other specified circumstances: Secondary | ICD-10-CM

## 2023-11-18 LAB — HEMOCCULT GUIAC POC 1CARD (OFFICE): Fecal Occult Blood, POC: NEGATIVE

## 2023-11-18 MED ORDER — NORETHINDRONE 0.35 MG PO TABS: 1.0000 | ORAL_TABLET | Freq: Every day | ORAL | 4 refills | Status: AC

## 2023-11-18 NOTE — Progress Notes (Signed)
 Patient ID: Christie Griffin, female   DOB: [birth year], 53 y.o.   MRN: [id number] History of Present Illness: Christie Griffin is a 53 year old white female,married, G1P1, in for a well woman gyn exam.    Component Value Date/Time   DIAGPAP  11/04/2021 0845    - Negative for intraepithelial lesion or malignancy (NILM)   DIAGPAP  10/17/2018 0000    NEGATIVE FOR INTRAEPITHELIAL LESIONS OR MALIGNANCY. BENIGN REACTIVE/REPARATIVE CHANGES.   HPVHIGH Negative 11/04/2021 0845   ADEQPAP  11/04/2021 0845    Satisfactory for evaluation; transformation zone component PRESENT.   ADEQPAP  10/17/2018 0000    Satisfactory for evaluation  endocervical/transformation zone component PRESENT.    PCP is Elberta Cone FNP  Current Medications, Allergies, Past Medical History, Past Surgical History, Family History and Social History were reviewed in [employer] electronic medical record.     Review of Systems: Patient denies any headaches, hearing loss, fatigue, blurred vision, shortness of breath, chest pain, abdominal pain, problems with bowel movements, urination, or intercourse. No joint pain or mood swings.  Periods not regular, had one in October then April and in July.  No hot flashes yet   Physical Exam:BP 123/83 (BP Location: Right Arm, Patient Position: Sitting, Cuff Size: Large)   Pulse 89   Ht 5' 7 (1.702 m)   Wt 207 lb (93.9 kg)   LMP 11/09/2023   BMI 32.42 kg/m   General:  Well developed, well nourished, no acute distress Skin:  Warm and dry Neck:  Midline trachea, normal thyroid, good ROM, no lymphadenopathy Lungs; Clear to auscultation bilaterally Breast:  No dominant palpable mass, retraction, or nipple discharge Cardiovascular: Regular rate and rhythm Abdomen:  Soft, non tender, no hepatosplenomegaly Pelvic:  External genitalia is normal in appearance, no lesions.  The vagina is normal in appearance. Urethra has no lesions or masses. The cervix is bulbous.  Uterus is felt to be  normal size, shape, and contour.  No adnexal masses or tenderness noted.Bladder is non tender, no masses felt. Rectal: Good sphincter tone, no polyps, or hemorrhoids felt.  Hemoccult negative. Extremities/musculoskeletal:  No swelling or varicosities noted, no clubbing or cyanosis Psych:  No mood changes, alert and cooperative,seems happy AA is 1 Fall risk is low    11/18/2023    1:32 PM 11/12/2022   11:02 AM 11/04/2021    8:38 AM  Depression screen PHQ 2/9  Decreased Interest 0 0 0  Down, Depressed, Hopeless 0 0 0  PHQ - 2 Score 0 0 0  Altered sleeping 0 1 0  Tired, decreased energy 0 0 0  Change in appetite 0 0 0  Feeling bad or failure about yourself  0 0 0  Trouble concentrating 0 0 0  Moving slowly or fidgety/restless 0 0 0  Suicidal thoughts 0 0 0  PHQ-9 Score 0 1 0       11/18/2023    1:32 PM 11/12/2022   11:02 AM 11/04/2021    8:46 AM 10/31/2020    8:41 AM  GAD 7 : Generalized Anxiety Score  Nervous, Anxious, on Edge 0 0 0 0  Control/stop worrying 0 0 0 0  Worry too much - different things 0 0 0 0  Trouble relaxing 0 0 0 0  Restless 0 0 0 0  Easily annoyed or irritable 0 0 3 0  Afraid - awful might happen 0 0 0 0  Total GAD 7 Score 0 0 3 0    Upstream - 11/18/23  1341       Pregnancy Intention Screening   Does the patient want to become pregnant in the next year? No    Does the patient's partner want to become pregnant in the next year? No    Would the patient like to discuss contraceptive options today? No      Contraception Wrap Up   Current Method Oral Contraceptive    End Method Oral Contraceptive    Contraception Counseling Provided Yes           Examination chaperoned by Clarita Salt LPN   Impression and plan: 1. Encounter for well woman exam with routine gynecological exam (Primary) Pap and physical in 1 year Labs with PCP Mammogram was today   2. Encounter for screening fecal occult blood testing Hemoccult was negative Will do cologuard, has  box at home  - POCT occult blood stool  3. Peri-menopause  4. Essential hypertension On norvasc  2.5 mg 1 daily has refills   5. Irregular periods Skipping periods, perimenopausal   6. Encounter for menstrual regulation On micronor , will continue  Meds ordered this encounter  Medications   norethindrone  (MICRONOR ) 0.35 MG tablet    Sig: Take 1 tablet (0.35 mg total) by mouth daily.    Dispense:  84 tablet    Refill:  4    Supervising Provider:   JAYNE MINDER H [[id number]]

## 2023-11-22 ENCOUNTER — Ambulatory Visit: Payer: Self-pay | Admitting: Adult Health

## 2023-11-25 LAB — COLOGUARD: COLOGUARD: NEGATIVE

## 2023-12-01 ENCOUNTER — Ambulatory Visit (INDEPENDENT_AMBULATORY_CARE_PROVIDER_SITE_OTHER): Payer: PRIVATE HEALTH INSURANCE

## 2023-12-01 DIAGNOSIS — J309 Allergic rhinitis, unspecified: Secondary | ICD-10-CM | POA: Diagnosis not present

## 2023-12-03 ENCOUNTER — Other Ambulatory Visit: Payer: Self-pay | Admitting: Allergy & Immunology

## 2024-01-12 ENCOUNTER — Ambulatory Visit (INDEPENDENT_AMBULATORY_CARE_PROVIDER_SITE_OTHER): Payer: PRIVATE HEALTH INSURANCE

## 2024-01-12 DIAGNOSIS — J309 Allergic rhinitis, unspecified: Secondary | ICD-10-CM

## 2024-01-21 ENCOUNTER — Ambulatory Visit (INDEPENDENT_AMBULATORY_CARE_PROVIDER_SITE_OTHER): Payer: PRIVATE HEALTH INSURANCE

## 2024-01-21 DIAGNOSIS — J309 Allergic rhinitis, unspecified: Secondary | ICD-10-CM

## 2024-02-02 ENCOUNTER — Ambulatory Visit: Payer: Self-pay

## 2024-02-02 DIAGNOSIS — J309 Allergic rhinitis, unspecified: Secondary | ICD-10-CM | POA: Diagnosis not present

## 2024-02-08 ENCOUNTER — Other Ambulatory Visit: Payer: Self-pay | Admitting: Adult Health

## 2024-02-08 MED ORDER — FLUCONAZOLE 150 MG PO TABS
ORAL_TABLET | ORAL | 2 refills | Status: AC
Start: 2024-02-08 — End: ?

## 2024-02-08 NOTE — Progress Notes (Signed)
 Rx diflucan.

## 2024-02-09 NOTE — Progress Notes (Signed)
 This encounter was created in error - please disregard.

## 2024-02-15 NOTE — Patient Instructions (Signed)
 Asthma Continue Symbicort  160-2 puffs twice a day with a spacer to prevent cough or wheeze For asthma flare, begin Airsupra  2 puffs if needed for cough or wheeze.  Do not use this medication more than 12 puffs in a 24-hour time span  Allergic rhinitis Continue allergen avoidance measures directed toward grass pollen, weed pollen, tree pollen, dog, dust mite as listed below Continue allergen immunotherapy and have access to an epinephrine  autoinjector set Continue cetirizine  10 mg once a day if needed for runny nose or itch Continue Flonase  1 to 2 sprays in each nostril once a day if needed for stuffy nose Consider saline nasal rinses as needed for nasal symptoms. Use this before any medicated nasal sprays for best result  Call the clinic if this treatment plan is not working well for you.  Follow up in 6 months or sooner if needed.  Reducing Pollen Exposure The [employer] , [employer] suggests the following steps to reduce your exposure to pollen during allergy  seasons. Do not hang sheets or clothing out to dry; pollen may collect on these items. Do not mow lawns or spend time around freshly cut grass; mowing stirs up pollen. Keep windows closed at night.  Keep car windows closed while driving. Minimize morning activities outdoors, a time when pollen counts are usually at their highest. Stay indoors as much as possible when pollen counts or humidity is high and on windy days when pollen tends to remain in the air longer. Use air conditioning when possible.  Many air conditioners have filters that trap the pollen spores. Use a HEPA room air filter to remove pollen form the indoor air you breathe.  Control of Dog or Cat Allergen Avoidance is the best way to manage a dog or cat allergy . If you have a dog or cat and are allergic to dog or cats, consider removing the dog or cat from the home. If you have a dog or cat but don't want to find it a new home, or if your  family wants a pet even though someone in the household is allergic, here are some strategies that may help keep symptoms at bay:  Keep the pet out of your bedroom and restrict it to only a few rooms. Be advised that keeping the dog or cat in only one room will not limit the allergens to that room. Don't pet, hug or kiss the dog or cat; if you do, wash your hands with soap and water. High-efficiency particulate air (HEPA) cleaners run continuously in a bedroom or living room can reduce allergen levels over time. Regular use of a high-efficiency vacuum cleaner or a central vacuum can reduce allergen levels. Giving your dog or cat a bath at least once a week can reduce airborne allergen.   Control of Dust Mite Allergen Dust mites play a major role in allergic asthma and rhinitis. They occur in environments with high humidity wherever human skin is found. Dust mites absorb humidity from the atmosphere (ie, they do not drink) and feed on organic matter (including shed human and animal skin). Dust mites are a microscopic type of insect that you cannot see with the naked eye. High levels of dust mites have been detected from mattresses, pillows, carpets, upholstered furniture, bed covers, clothes, soft toys and any woven material. The principal allergen of the dust mite is found in its feces. A gram of dust may contain 1,000 mites and 250,000 fecal particles. Mite antigen is easily measured in  the air during house cleaning activities. Dust mites do not bite and do not cause harm to humans, other than by triggering allergies/asthma.  Ways to decrease your exposure to dust mites in your home:  1. Encase mattresses, box springs and pillows with a mite-impermeable barrier or cover  2. Wash sheets, blankets and drapes weekly in hot water (130 F) with detergent and dry them in a dryer on the hot setting.  3. Have the room cleaned frequently with a vacuum cleaner and a damp dust-mop. For carpeting or rugs,  vacuuming with a vacuum cleaner equipped with a high-efficiency particulate air (HEPA) filter. The dust mite allergic individual should not be in a room which is being cleaned and should wait 1 hour after cleaning before going into the room.  4. Do not sleep on upholstered furniture (eg, couches).  5. If possible removing carpeting, upholstered furniture and drapery from the home is ideal. Horizontal blinds should be eliminated in the rooms where the person spends the most time (bedroom, study, television room). Washable vinyl, roller-type shades are optimal.  6. Remove all non-washable stuffed toys from the bedroom. Wash stuffed toys weekly like sheets and blankets above.  7. Reduce indoor humidity to less than 50%. Inexpensive humidity monitors can be purchased at most hardware stores. Do not use a humidifier as can make the problem worse and are not recommended.

## 2024-02-15 NOTE — Progress Notes (Signed)
 [address] [place] [address] Dept: [phone]  FOLLOW UP NOTE  Patient ID: Christie Griffin, female    DOB: 04/02/1971  Age: 53 y.o. MRN: [id number] Date of Office Visit: 02/16/2024  Assessment  Chief Complaint: Follow-up  HPI Christie Griffin is a 53 year old female who presents to the clinic for follow-up visit.  She was last seen in this clinic on 11/03/2023 by Dr. Iva for evaluation of asthma and allergic rhinitis on allergen immunotherapy.  Discussed the use of AI scribe software for clinical note transcription with the patient, who gave verbal consent to proceed.  History of Present Illness Christie Griffin is a 53 year old female who presents for follow-up regarding her respiratory and allergy  management.  In September, she experienced a sudden onset of sinus issues while at work, leading her to seek treatment at employee health. She was prescribed antibiotics and steroids, which improved her symptoms. This type of episode tends to occur annually around September.  No breathing difficulties, shortness of breath, wheezing, or coughing, even during her recent illness. She uses Symbicort  daily, two puffs in the morning and two at night and has not needed to use her rescue inhaler, Airsupra .  Her last Fasenra  injection was 2 months ago.  She reports that her asthma has been well-controlled with no Fasenra  at this time.  She experiences ear discomfort and throat drainage as initial symptoms of her sinus issues. She uses Flonase  daily, applying it to the side, and takes cetirizine  (Zyrtec ) twice daily, especially during this time of year. She finds these medications effective in managing her symptoms of allergic rhinitis.  She continues allergen immunotherapy directed toward grass pollen, weed pollen, tree pollen, dogs, and dust mite with no large or local immunotherapy.  She reached maintenance 06/2022.  EpiPen  set is up-to-date.  Her  current medications are listed in the chart.   Drug Allergies:  No Known Allergies  Physical Exam: BP 110/72 (BP Location: Left Arm, Patient Position: Sitting, Cuff Size: Normal)   Pulse (!) 105   Temp 97.7 F (36.5 C) (Temporal)   Resp 18   Ht 5' 6.93 (1.7 m)   Wt 208 lb 9.6 oz (94.6 kg)   SpO2 96%   BMI 32.74 kg/m    Physical Exam Vitals reviewed.  Constitutional:      Appearance: Normal appearance.  HENT:     Head: Normocephalic and atraumatic.     Right Ear: Tympanic membrane normal.     Left Ear: Tympanic membrane normal.     Nose:     Comments: Bilateral nares slightly erythematous with clear nasal drainage noted.  Pharynx normal.  Ears normal.  Eyes normal.    Mouth/Throat:     Pharynx: Oropharynx is clear.  Eyes:     Conjunctiva/sclera: Conjunctivae normal.  Cardiovascular:     Rate and Rhythm: Normal rate and regular rhythm.     Heart sounds: Normal heart sounds. No murmur heard. Pulmonary:     Effort: Pulmonary effort is normal.     Breath sounds: Normal breath sounds.     Comments: Lungs clear to auscultation Musculoskeletal:        General: Normal range of motion.     Cervical back: Normal range of motion and neck supple.  Skin:    General: Skin is warm and dry.  Neurological:     Mental Status: She is alert and oriented to person, place, and time.  Psychiatric:  Mood and Affect: Mood normal.        Behavior: Behavior normal.        Thought Content: Thought content normal.        Judgment: Judgment normal.     Assessment and Plan: 1. Severe persistent asthma without complication (HCC)   2. Seasonal and perennial allergic rhinitis     Patient Instructions  Asthma Continue Symbicort  160-2 puffs twice a day with a spacer to prevent cough or wheeze For asthma flare, begin Airsupra  2 puffs if needed for cough or wheeze.  Do not use this medication more than 12 puffs in a 24-hour time span  Allergic rhinitis Continue allergen avoidance  measures directed toward grass pollen, weed pollen, tree pollen, dog, dust mite as listed below Continue allergen immunotherapy and have access to an epinephrine  autoinjector set Continue cetirizine  10 mg once a day if needed for runny nose or itch Continue Flonase  1 to 2 sprays in each nostril once a day if needed for stuffy nose Consider saline nasal rinses as needed for nasal symptoms. Use this before any medicated nasal sprays for best result  Call the clinic if this treatment plan is not working well for you.  Follow up in 6 months or sooner if needed.   Return in about 6 months (around 08/16/2024), or if symptoms worsen or fail to improve.    Thank you for the opportunity to care for this patient.  Please do not hesitate to contact me with questions.  Arlean Mutter, FNP [facility] 

## 2024-02-16 ENCOUNTER — Ambulatory Visit: Payer: PRIVATE HEALTH INSURANCE | Admitting: Family Medicine

## 2024-02-16 ENCOUNTER — Encounter: Payer: Self-pay | Admitting: Family Medicine

## 2024-02-16 ENCOUNTER — Other Ambulatory Visit: Payer: Self-pay

## 2024-02-16 VITALS — BP 110/72 | HR 105 | Temp 97.7°F | Resp 18 | Ht 66.93 in | Wt 208.6 lb

## 2024-02-16 DIAGNOSIS — J302 Other seasonal allergic rhinitis: Secondary | ICD-10-CM

## 2024-02-16 DIAGNOSIS — J3089 Other allergic rhinitis: Secondary | ICD-10-CM | POA: Diagnosis not present

## 2024-02-16 DIAGNOSIS — J4550 Severe persistent asthma, uncomplicated: Secondary | ICD-10-CM | POA: Insufficient documentation

## 2024-03-01 ENCOUNTER — Ambulatory Visit (INDEPENDENT_AMBULATORY_CARE_PROVIDER_SITE_OTHER): Payer: PRIVATE HEALTH INSURANCE

## 2024-03-01 DIAGNOSIS — J309 Allergic rhinitis, unspecified: Secondary | ICD-10-CM

## 2024-03-31 ENCOUNTER — Ambulatory Visit: Payer: PRIVATE HEALTH INSURANCE

## 2024-03-31 DIAGNOSIS — J309 Allergic rhinitis, unspecified: Secondary | ICD-10-CM

## 2024-05-03 ENCOUNTER — Ambulatory Visit (INDEPENDENT_AMBULATORY_CARE_PROVIDER_SITE_OTHER)

## 2024-05-03 DIAGNOSIS — J302 Other seasonal allergic rhinitis: Secondary | ICD-10-CM | POA: Diagnosis not present

## 2024-05-12 ENCOUNTER — Other Ambulatory Visit: Payer: Self-pay | Admitting: Allergy & Immunology

## 2024-08-18 ENCOUNTER — Ambulatory Visit: Payer: PRIVATE HEALTH INSURANCE | Admitting: Family Medicine
